# Patient Record
Sex: Female | Born: 1945 | Race: White | Hispanic: No | Marital: Married | Smoking: Current every day smoker
Health system: Southern US, Community
[De-identification: ages and names within clinical notes are randomized; demographics above are authoritative.]

## PROBLEM LIST (undated history)

## (undated) DIAGNOSIS — I10 Essential (primary) hypertension: Secondary | ICD-10-CM

---

## 2001-09-12 ENCOUNTER — Ambulatory Visit (HOSPITAL_COMMUNITY): Admission: RE | Admit: 2001-09-12 | Discharge: 2001-09-12 | Payer: Self-pay | Admitting: Pulmonary Disease

## 2009-12-05 ENCOUNTER — Ambulatory Visit (HOSPITAL_COMMUNITY): Admission: RE | Admit: 2009-12-05 | Discharge: 2009-12-05 | Payer: Self-pay | Admitting: Pulmonary Disease

## 2013-03-16 DIAGNOSIS — L570 Actinic keratosis: Secondary | ICD-10-CM | POA: Diagnosis not present

## 2013-03-16 DIAGNOSIS — D0439 Carcinoma in situ of skin of other parts of face: Secondary | ICD-10-CM | POA: Diagnosis not present

## 2013-04-04 DIAGNOSIS — J209 Acute bronchitis, unspecified: Secondary | ICD-10-CM | POA: Diagnosis not present

## 2013-04-04 DIAGNOSIS — J449 Chronic obstructive pulmonary disease, unspecified: Secondary | ICD-10-CM | POA: Diagnosis not present

## 2013-04-04 DIAGNOSIS — J019 Acute sinusitis, unspecified: Secondary | ICD-10-CM | POA: Diagnosis not present

## 2013-04-04 DIAGNOSIS — I10 Essential (primary) hypertension: Secondary | ICD-10-CM | POA: Diagnosis not present

## 2013-04-21 DIAGNOSIS — C44320 Squamous cell carcinoma of skin of unspecified parts of face: Secondary | ICD-10-CM | POA: Diagnosis not present

## 2013-09-06 DIAGNOSIS — Z23 Encounter for immunization: Secondary | ICD-10-CM | POA: Diagnosis not present

## 2014-03-20 DIAGNOSIS — I10 Essential (primary) hypertension: Secondary | ICD-10-CM | POA: Diagnosis not present

## 2014-03-20 DIAGNOSIS — E785 Hyperlipidemia, unspecified: Secondary | ICD-10-CM | POA: Diagnosis not present

## 2014-03-20 DIAGNOSIS — J209 Acute bronchitis, unspecified: Secondary | ICD-10-CM | POA: Diagnosis not present

## 2014-03-20 DIAGNOSIS — J449 Chronic obstructive pulmonary disease, unspecified: Secondary | ICD-10-CM | POA: Diagnosis not present

## 2014-03-21 DIAGNOSIS — S60229A Contusion of unspecified hand, initial encounter: Secondary | ICD-10-CM | POA: Diagnosis not present

## 2014-03-21 DIAGNOSIS — S93409A Sprain of unspecified ligament of unspecified ankle, initial encounter: Secondary | ICD-10-CM | POA: Diagnosis not present

## 2014-03-27 DIAGNOSIS — J449 Chronic obstructive pulmonary disease, unspecified: Secondary | ICD-10-CM | POA: Diagnosis not present

## 2014-03-27 DIAGNOSIS — J019 Acute sinusitis, unspecified: Secondary | ICD-10-CM | POA: Diagnosis not present

## 2014-03-27 DIAGNOSIS — J209 Acute bronchitis, unspecified: Secondary | ICD-10-CM | POA: Diagnosis not present

## 2014-04-21 DIAGNOSIS — T148 Other injury of unspecified body region: Secondary | ICD-10-CM | POA: Diagnosis not present

## 2014-04-26 DIAGNOSIS — H4011X0 Primary open-angle glaucoma, stage unspecified: Secondary | ICD-10-CM | POA: Diagnosis not present

## 2014-04-28 ENCOUNTER — Other Ambulatory Visit (HOSPITAL_COMMUNITY): Payer: Self-pay | Admitting: Pulmonary Disease

## 2014-04-28 DIAGNOSIS — R51 Headache: Principal | ICD-10-CM

## 2014-04-28 DIAGNOSIS — R110 Nausea: Secondary | ICD-10-CM

## 2014-04-28 DIAGNOSIS — R519 Headache, unspecified: Secondary | ICD-10-CM

## 2014-05-01 ENCOUNTER — Ambulatory Visit (HOSPITAL_COMMUNITY)
Admission: RE | Admit: 2014-05-01 | Discharge: 2014-05-01 | Disposition: A | Discharge status: Home / Self Care | Payer: Medicare Other | Source: Ambulatory Visit | Attending: Pulmonary Disease | Admitting: Pulmonary Disease

## 2014-05-01 DIAGNOSIS — I739 Peripheral vascular disease, unspecified: Secondary | ICD-10-CM | POA: Insufficient documentation

## 2014-05-01 DIAGNOSIS — R519 Headache, unspecified: Secondary | ICD-10-CM

## 2014-05-01 DIAGNOSIS — R110 Nausea: Secondary | ICD-10-CM | POA: Insufficient documentation

## 2014-05-01 DIAGNOSIS — R51 Headache: Secondary | ICD-10-CM

## 2014-05-01 DIAGNOSIS — R42 Dizziness and giddiness: Secondary | ICD-10-CM | POA: Insufficient documentation

## 2014-06-12 DIAGNOSIS — D239 Other benign neoplasm of skin, unspecified: Secondary | ICD-10-CM | POA: Diagnosis not present

## 2014-06-12 DIAGNOSIS — L570 Actinic keratosis: Secondary | ICD-10-CM | POA: Diagnosis not present

## 2014-06-12 DIAGNOSIS — L821 Other seborrheic keratosis: Secondary | ICD-10-CM | POA: Diagnosis not present

## 2014-06-28 DIAGNOSIS — E785 Hyperlipidemia, unspecified: Secondary | ICD-10-CM | POA: Diagnosis not present

## 2014-06-28 DIAGNOSIS — F3289 Other specified depressive episodes: Secondary | ICD-10-CM | POA: Diagnosis not present

## 2014-06-28 DIAGNOSIS — F329 Major depressive disorder, single episode, unspecified: Secondary | ICD-10-CM | POA: Diagnosis not present

## 2014-06-28 DIAGNOSIS — I10 Essential (primary) hypertension: Secondary | ICD-10-CM | POA: Diagnosis not present

## 2014-10-03 DIAGNOSIS — I10 Essential (primary) hypertension: Secondary | ICD-10-CM | POA: Diagnosis not present

## 2014-10-03 DIAGNOSIS — Z23 Encounter for immunization: Secondary | ICD-10-CM | POA: Diagnosis not present

## 2014-10-03 DIAGNOSIS — M545 Low back pain: Secondary | ICD-10-CM | POA: Diagnosis not present

## 2014-10-03 DIAGNOSIS — J449 Chronic obstructive pulmonary disease, unspecified: Secondary | ICD-10-CM | POA: Diagnosis not present

## 2014-10-03 DIAGNOSIS — F329 Major depressive disorder, single episode, unspecified: Secondary | ICD-10-CM | POA: Diagnosis not present

## 2015-01-03 DIAGNOSIS — M545 Low back pain: Secondary | ICD-10-CM | POA: Diagnosis not present

## 2015-01-03 DIAGNOSIS — J449 Chronic obstructive pulmonary disease, unspecified: Secondary | ICD-10-CM | POA: Diagnosis not present

## 2015-01-03 DIAGNOSIS — I10 Essential (primary) hypertension: Secondary | ICD-10-CM | POA: Diagnosis not present

## 2015-01-03 DIAGNOSIS — F419 Anxiety disorder, unspecified: Secondary | ICD-10-CM | POA: Diagnosis not present

## 2015-03-13 DIAGNOSIS — L570 Actinic keratosis: Secondary | ICD-10-CM | POA: Diagnosis not present

## 2015-03-13 DIAGNOSIS — D485 Neoplasm of uncertain behavior of skin: Secondary | ICD-10-CM | POA: Diagnosis not present

## 2015-05-10 DIAGNOSIS — J449 Chronic obstructive pulmonary disease, unspecified: Secondary | ICD-10-CM | POA: Diagnosis not present

## 2015-05-10 DIAGNOSIS — J209 Acute bronchitis, unspecified: Secondary | ICD-10-CM | POA: Diagnosis not present

## 2015-05-10 DIAGNOSIS — F419 Anxiety disorder, unspecified: Secondary | ICD-10-CM | POA: Diagnosis not present

## 2015-05-10 DIAGNOSIS — M545 Low back pain: Secondary | ICD-10-CM | POA: Diagnosis not present

## 2015-05-26 DIAGNOSIS — M4316 Spondylolisthesis, lumbar region: Secondary | ICD-10-CM | POA: Diagnosis not present

## 2015-05-26 DIAGNOSIS — M545 Low back pain: Secondary | ICD-10-CM | POA: Diagnosis not present

## 2015-05-26 DIAGNOSIS — M4150 Other secondary scoliosis, site unspecified: Secondary | ICD-10-CM | POA: Diagnosis not present

## 2015-06-26 DIAGNOSIS — M5416 Radiculopathy, lumbar region: Secondary | ICD-10-CM | POA: Diagnosis not present

## 2015-10-15 DIAGNOSIS — E785 Hyperlipidemia, unspecified: Secondary | ICD-10-CM | POA: Diagnosis not present

## 2015-10-15 DIAGNOSIS — J449 Chronic obstructive pulmonary disease, unspecified: Secondary | ICD-10-CM | POA: Diagnosis not present

## 2015-10-15 DIAGNOSIS — E538 Deficiency of other specified B group vitamins: Secondary | ICD-10-CM | POA: Diagnosis not present

## 2015-10-15 DIAGNOSIS — I10 Essential (primary) hypertension: Secondary | ICD-10-CM | POA: Diagnosis not present

## 2015-11-07 DIAGNOSIS — H401131 Primary open-angle glaucoma, bilateral, mild stage: Secondary | ICD-10-CM | POA: Diagnosis not present

## 2015-11-12 DIAGNOSIS — J449 Chronic obstructive pulmonary disease, unspecified: Secondary | ICD-10-CM | POA: Diagnosis not present

## 2015-11-12 DIAGNOSIS — M545 Low back pain: Secondary | ICD-10-CM | POA: Diagnosis not present

## 2015-11-12 DIAGNOSIS — T7840XA Allergy, unspecified, initial encounter: Secondary | ICD-10-CM | POA: Diagnosis not present

## 2015-11-12 DIAGNOSIS — I10 Essential (primary) hypertension: Secondary | ICD-10-CM | POA: Diagnosis not present

## 2015-11-14 DIAGNOSIS — D485 Neoplasm of uncertain behavior of skin: Secondary | ICD-10-CM | POA: Diagnosis not present

## 2015-11-14 DIAGNOSIS — L570 Actinic keratosis: Secondary | ICD-10-CM | POA: Diagnosis not present

## 2015-11-14 DIAGNOSIS — L270 Generalized skin eruption due to drugs and medicaments taken internally: Secondary | ICD-10-CM | POA: Diagnosis not present

## 2016-01-23 DIAGNOSIS — I10 Essential (primary) hypertension: Secondary | ICD-10-CM | POA: Diagnosis not present

## 2016-01-23 DIAGNOSIS — F419 Anxiety disorder, unspecified: Secondary | ICD-10-CM | POA: Diagnosis not present

## 2016-01-23 DIAGNOSIS — J441 Chronic obstructive pulmonary disease with (acute) exacerbation: Secondary | ICD-10-CM | POA: Diagnosis not present

## 2016-01-23 DIAGNOSIS — M545 Low back pain: Secondary | ICD-10-CM | POA: Diagnosis not present

## 2016-02-13 DIAGNOSIS — M5136 Other intervertebral disc degeneration, lumbar region: Secondary | ICD-10-CM | POA: Diagnosis not present

## 2016-02-13 DIAGNOSIS — M62830 Muscle spasm of back: Secondary | ICD-10-CM | POA: Diagnosis not present

## 2016-02-13 DIAGNOSIS — M9903 Segmental and somatic dysfunction of lumbar region: Secondary | ICD-10-CM | POA: Diagnosis not present

## 2016-02-13 DIAGNOSIS — M545 Low back pain: Secondary | ICD-10-CM | POA: Diagnosis not present

## 2016-02-14 DIAGNOSIS — M5136 Other intervertebral disc degeneration, lumbar region: Secondary | ICD-10-CM | POA: Diagnosis not present

## 2016-02-14 DIAGNOSIS — M62830 Muscle spasm of back: Secondary | ICD-10-CM | POA: Diagnosis not present

## 2016-02-14 DIAGNOSIS — M9903 Segmental and somatic dysfunction of lumbar region: Secondary | ICD-10-CM | POA: Diagnosis not present

## 2016-02-14 DIAGNOSIS — M545 Low back pain: Secondary | ICD-10-CM | POA: Diagnosis not present

## 2016-02-16 DIAGNOSIS — M5136 Other intervertebral disc degeneration, lumbar region: Secondary | ICD-10-CM | POA: Diagnosis not present

## 2016-02-16 DIAGNOSIS — M545 Low back pain: Secondary | ICD-10-CM | POA: Diagnosis not present

## 2016-02-16 DIAGNOSIS — M62830 Muscle spasm of back: Secondary | ICD-10-CM | POA: Diagnosis not present

## 2016-02-16 DIAGNOSIS — M9903 Segmental and somatic dysfunction of lumbar region: Secondary | ICD-10-CM | POA: Diagnosis not present

## 2016-02-19 DIAGNOSIS — M545 Low back pain: Secondary | ICD-10-CM | POA: Diagnosis not present

## 2016-02-19 DIAGNOSIS — M9903 Segmental and somatic dysfunction of lumbar region: Secondary | ICD-10-CM | POA: Diagnosis not present

## 2016-02-19 DIAGNOSIS — M5136 Other intervertebral disc degeneration, lumbar region: Secondary | ICD-10-CM | POA: Diagnosis not present

## 2016-02-19 DIAGNOSIS — M62830 Muscle spasm of back: Secondary | ICD-10-CM | POA: Diagnosis not present

## 2016-02-21 DIAGNOSIS — M545 Low back pain: Secondary | ICD-10-CM | POA: Diagnosis not present

## 2016-02-21 DIAGNOSIS — M62830 Muscle spasm of back: Secondary | ICD-10-CM | POA: Diagnosis not present

## 2016-02-21 DIAGNOSIS — M5136 Other intervertebral disc degeneration, lumbar region: Secondary | ICD-10-CM | POA: Diagnosis not present

## 2016-02-21 DIAGNOSIS — M9903 Segmental and somatic dysfunction of lumbar region: Secondary | ICD-10-CM | POA: Diagnosis not present

## 2016-02-23 DIAGNOSIS — M9903 Segmental and somatic dysfunction of lumbar region: Secondary | ICD-10-CM | POA: Diagnosis not present

## 2016-02-23 DIAGNOSIS — M545 Low back pain: Secondary | ICD-10-CM | POA: Diagnosis not present

## 2016-02-23 DIAGNOSIS — M5136 Other intervertebral disc degeneration, lumbar region: Secondary | ICD-10-CM | POA: Diagnosis not present

## 2016-02-23 DIAGNOSIS — M62830 Muscle spasm of back: Secondary | ICD-10-CM | POA: Diagnosis not present

## 2016-02-26 DIAGNOSIS — M62830 Muscle spasm of back: Secondary | ICD-10-CM | POA: Diagnosis not present

## 2016-02-26 DIAGNOSIS — M5136 Other intervertebral disc degeneration, lumbar region: Secondary | ICD-10-CM | POA: Diagnosis not present

## 2016-02-26 DIAGNOSIS — M545 Low back pain: Secondary | ICD-10-CM | POA: Diagnosis not present

## 2016-02-26 DIAGNOSIS — M9903 Segmental and somatic dysfunction of lumbar region: Secondary | ICD-10-CM | POA: Diagnosis not present

## 2016-02-28 DIAGNOSIS — M9903 Segmental and somatic dysfunction of lumbar region: Secondary | ICD-10-CM | POA: Diagnosis not present

## 2016-02-28 DIAGNOSIS — M5136 Other intervertebral disc degeneration, lumbar region: Secondary | ICD-10-CM | POA: Diagnosis not present

## 2016-02-28 DIAGNOSIS — M62830 Muscle spasm of back: Secondary | ICD-10-CM | POA: Diagnosis not present

## 2016-02-28 DIAGNOSIS — M545 Low back pain: Secondary | ICD-10-CM | POA: Diagnosis not present

## 2016-03-01 DIAGNOSIS — M62830 Muscle spasm of back: Secondary | ICD-10-CM | POA: Diagnosis not present

## 2016-03-01 DIAGNOSIS — M9903 Segmental and somatic dysfunction of lumbar region: Secondary | ICD-10-CM | POA: Diagnosis not present

## 2016-03-01 DIAGNOSIS — M5136 Other intervertebral disc degeneration, lumbar region: Secondary | ICD-10-CM | POA: Diagnosis not present

## 2016-03-01 DIAGNOSIS — M545 Low back pain: Secondary | ICD-10-CM | POA: Diagnosis not present

## 2016-03-03 DIAGNOSIS — M5136 Other intervertebral disc degeneration, lumbar region: Secondary | ICD-10-CM | POA: Diagnosis not present

## 2016-03-03 DIAGNOSIS — M62830 Muscle spasm of back: Secondary | ICD-10-CM | POA: Diagnosis not present

## 2016-03-03 DIAGNOSIS — M545 Low back pain: Secondary | ICD-10-CM | POA: Diagnosis not present

## 2016-03-03 DIAGNOSIS — M9903 Segmental and somatic dysfunction of lumbar region: Secondary | ICD-10-CM | POA: Diagnosis not present

## 2016-03-04 DIAGNOSIS — M62830 Muscle spasm of back: Secondary | ICD-10-CM | POA: Diagnosis not present

## 2016-03-04 DIAGNOSIS — M5136 Other intervertebral disc degeneration, lumbar region: Secondary | ICD-10-CM | POA: Diagnosis not present

## 2016-03-04 DIAGNOSIS — M545 Low back pain: Secondary | ICD-10-CM | POA: Diagnosis not present

## 2016-03-04 DIAGNOSIS — M9903 Segmental and somatic dysfunction of lumbar region: Secondary | ICD-10-CM | POA: Diagnosis not present

## 2016-03-06 DIAGNOSIS — M5136 Other intervertebral disc degeneration, lumbar region: Secondary | ICD-10-CM | POA: Diagnosis not present

## 2016-03-06 DIAGNOSIS — M9903 Segmental and somatic dysfunction of lumbar region: Secondary | ICD-10-CM | POA: Diagnosis not present

## 2016-03-06 DIAGNOSIS — M545 Low back pain: Secondary | ICD-10-CM | POA: Diagnosis not present

## 2016-03-06 DIAGNOSIS — M62830 Muscle spasm of back: Secondary | ICD-10-CM | POA: Diagnosis not present

## 2016-03-11 DIAGNOSIS — M9903 Segmental and somatic dysfunction of lumbar region: Secondary | ICD-10-CM | POA: Diagnosis not present

## 2016-03-11 DIAGNOSIS — M545 Low back pain: Secondary | ICD-10-CM | POA: Diagnosis not present

## 2016-03-11 DIAGNOSIS — M62830 Muscle spasm of back: Secondary | ICD-10-CM | POA: Diagnosis not present

## 2016-03-11 DIAGNOSIS — M5136 Other intervertebral disc degeneration, lumbar region: Secondary | ICD-10-CM | POA: Diagnosis not present

## 2016-03-14 DIAGNOSIS — M62830 Muscle spasm of back: Secondary | ICD-10-CM | POA: Diagnosis not present

## 2016-03-14 DIAGNOSIS — M545 Low back pain: Secondary | ICD-10-CM | POA: Diagnosis not present

## 2016-03-14 DIAGNOSIS — M9903 Segmental and somatic dysfunction of lumbar region: Secondary | ICD-10-CM | POA: Diagnosis not present

## 2016-03-14 DIAGNOSIS — M5136 Other intervertebral disc degeneration, lumbar region: Secondary | ICD-10-CM | POA: Diagnosis not present

## 2016-03-18 DIAGNOSIS — M5136 Other intervertebral disc degeneration, lumbar region: Secondary | ICD-10-CM | POA: Diagnosis not present

## 2016-03-18 DIAGNOSIS — M62830 Muscle spasm of back: Secondary | ICD-10-CM | POA: Diagnosis not present

## 2016-03-18 DIAGNOSIS — M545 Low back pain: Secondary | ICD-10-CM | POA: Diagnosis not present

## 2016-03-18 DIAGNOSIS — M9903 Segmental and somatic dysfunction of lumbar region: Secondary | ICD-10-CM | POA: Diagnosis not present

## 2016-03-20 DIAGNOSIS — M9903 Segmental and somatic dysfunction of lumbar region: Secondary | ICD-10-CM | POA: Diagnosis not present

## 2016-03-20 DIAGNOSIS — M62830 Muscle spasm of back: Secondary | ICD-10-CM | POA: Diagnosis not present

## 2016-03-20 DIAGNOSIS — M545 Low back pain: Secondary | ICD-10-CM | POA: Diagnosis not present

## 2016-03-20 DIAGNOSIS — M5136 Other intervertebral disc degeneration, lumbar region: Secondary | ICD-10-CM | POA: Diagnosis not present

## 2016-03-22 DIAGNOSIS — M62830 Muscle spasm of back: Secondary | ICD-10-CM | POA: Diagnosis not present

## 2016-03-22 DIAGNOSIS — M545 Low back pain: Secondary | ICD-10-CM | POA: Diagnosis not present

## 2016-03-22 DIAGNOSIS — M9903 Segmental and somatic dysfunction of lumbar region: Secondary | ICD-10-CM | POA: Diagnosis not present

## 2016-03-22 DIAGNOSIS — M5136 Other intervertebral disc degeneration, lumbar region: Secondary | ICD-10-CM | POA: Diagnosis not present

## 2016-03-25 DIAGNOSIS — M5136 Other intervertebral disc degeneration, lumbar region: Secondary | ICD-10-CM | POA: Diagnosis not present

## 2016-03-25 DIAGNOSIS — M545 Low back pain: Secondary | ICD-10-CM | POA: Diagnosis not present

## 2016-03-25 DIAGNOSIS — M9903 Segmental and somatic dysfunction of lumbar region: Secondary | ICD-10-CM | POA: Diagnosis not present

## 2016-03-25 DIAGNOSIS — M62830 Muscle spasm of back: Secondary | ICD-10-CM | POA: Diagnosis not present

## 2016-03-26 DIAGNOSIS — M9903 Segmental and somatic dysfunction of lumbar region: Secondary | ICD-10-CM | POA: Diagnosis not present

## 2016-03-26 DIAGNOSIS — I10 Essential (primary) hypertension: Secondary | ICD-10-CM | POA: Diagnosis not present

## 2016-03-26 DIAGNOSIS — J449 Chronic obstructive pulmonary disease, unspecified: Secondary | ICD-10-CM | POA: Diagnosis not present

## 2016-03-26 DIAGNOSIS — M545 Low back pain: Secondary | ICD-10-CM | POA: Diagnosis not present

## 2016-03-26 DIAGNOSIS — M62830 Muscle spasm of back: Secondary | ICD-10-CM | POA: Diagnosis not present

## 2016-03-26 DIAGNOSIS — M5136 Other intervertebral disc degeneration, lumbar region: Secondary | ICD-10-CM | POA: Diagnosis not present

## 2016-03-27 DIAGNOSIS — M62830 Muscle spasm of back: Secondary | ICD-10-CM | POA: Diagnosis not present

## 2016-03-27 DIAGNOSIS — M5136 Other intervertebral disc degeneration, lumbar region: Secondary | ICD-10-CM | POA: Diagnosis not present

## 2016-03-27 DIAGNOSIS — M9903 Segmental and somatic dysfunction of lumbar region: Secondary | ICD-10-CM | POA: Diagnosis not present

## 2016-03-27 DIAGNOSIS — M545 Low back pain: Secondary | ICD-10-CM | POA: Diagnosis not present

## 2016-04-01 DIAGNOSIS — M9903 Segmental and somatic dysfunction of lumbar region: Secondary | ICD-10-CM | POA: Diagnosis not present

## 2016-04-01 DIAGNOSIS — M62830 Muscle spasm of back: Secondary | ICD-10-CM | POA: Diagnosis not present

## 2016-04-01 DIAGNOSIS — M5136 Other intervertebral disc degeneration, lumbar region: Secondary | ICD-10-CM | POA: Diagnosis not present

## 2016-04-01 DIAGNOSIS — M545 Low back pain: Secondary | ICD-10-CM | POA: Diagnosis not present

## 2016-04-02 DIAGNOSIS — M5136 Other intervertebral disc degeneration, lumbar region: Secondary | ICD-10-CM | POA: Diagnosis not present

## 2016-04-02 DIAGNOSIS — M545 Low back pain: Secondary | ICD-10-CM | POA: Diagnosis not present

## 2016-04-02 DIAGNOSIS — M9903 Segmental and somatic dysfunction of lumbar region: Secondary | ICD-10-CM | POA: Diagnosis not present

## 2016-04-02 DIAGNOSIS — M62830 Muscle spasm of back: Secondary | ICD-10-CM | POA: Diagnosis not present

## 2016-04-10 DIAGNOSIS — M5136 Other intervertebral disc degeneration, lumbar region: Secondary | ICD-10-CM | POA: Diagnosis not present

## 2016-04-10 DIAGNOSIS — M5442 Lumbago with sciatica, left side: Secondary | ICD-10-CM | POA: Diagnosis not present

## 2016-04-10 DIAGNOSIS — M1288 Other specific arthropathies, not elsewhere classified, other specified site: Secondary | ICD-10-CM | POA: Diagnosis not present

## 2016-04-10 DIAGNOSIS — M4316 Spondylolisthesis, lumbar region: Secondary | ICD-10-CM | POA: Diagnosis not present

## 2016-04-19 DIAGNOSIS — M545 Low back pain: Secondary | ICD-10-CM | POA: Diagnosis not present

## 2016-04-23 DIAGNOSIS — M5136 Other intervertebral disc degeneration, lumbar region: Secondary | ICD-10-CM | POA: Diagnosis not present

## 2016-04-23 DIAGNOSIS — M9903 Segmental and somatic dysfunction of lumbar region: Secondary | ICD-10-CM | POA: Diagnosis not present

## 2016-04-23 DIAGNOSIS — M545 Low back pain: Secondary | ICD-10-CM | POA: Diagnosis not present

## 2016-04-23 DIAGNOSIS — M62830 Muscle spasm of back: Secondary | ICD-10-CM | POA: Diagnosis not present

## 2016-04-25 DIAGNOSIS — M4150 Other secondary scoliosis, site unspecified: Secondary | ICD-10-CM | POA: Diagnosis not present

## 2016-04-25 DIAGNOSIS — M5136 Other intervertebral disc degeneration, lumbar region: Secondary | ICD-10-CM | POA: Diagnosis not present

## 2016-04-25 DIAGNOSIS — M4316 Spondylolisthesis, lumbar region: Secondary | ICD-10-CM | POA: Diagnosis not present

## 2016-05-01 DIAGNOSIS — M5136 Other intervertebral disc degeneration, lumbar region: Secondary | ICD-10-CM | POA: Diagnosis not present

## 2016-05-01 DIAGNOSIS — M62830 Muscle spasm of back: Secondary | ICD-10-CM | POA: Diagnosis not present

## 2016-05-01 DIAGNOSIS — M9903 Segmental and somatic dysfunction of lumbar region: Secondary | ICD-10-CM | POA: Diagnosis not present

## 2016-05-01 DIAGNOSIS — M545 Low back pain: Secondary | ICD-10-CM | POA: Diagnosis not present

## 2016-05-06 DIAGNOSIS — M62830 Muscle spasm of back: Secondary | ICD-10-CM | POA: Diagnosis not present

## 2016-05-06 DIAGNOSIS — M5136 Other intervertebral disc degeneration, lumbar region: Secondary | ICD-10-CM | POA: Diagnosis not present

## 2016-05-06 DIAGNOSIS — M9903 Segmental and somatic dysfunction of lumbar region: Secondary | ICD-10-CM | POA: Diagnosis not present

## 2016-05-06 DIAGNOSIS — M545 Low back pain: Secondary | ICD-10-CM | POA: Diagnosis not present

## 2016-05-08 DIAGNOSIS — M9903 Segmental and somatic dysfunction of lumbar region: Secondary | ICD-10-CM | POA: Diagnosis not present

## 2016-05-08 DIAGNOSIS — M545 Low back pain: Secondary | ICD-10-CM | POA: Diagnosis not present

## 2016-05-08 DIAGNOSIS — M5136 Other intervertebral disc degeneration, lumbar region: Secondary | ICD-10-CM | POA: Diagnosis not present

## 2016-05-08 DIAGNOSIS — M62830 Muscle spasm of back: Secondary | ICD-10-CM | POA: Diagnosis not present

## 2016-05-13 DIAGNOSIS — M5136 Other intervertebral disc degeneration, lumbar region: Secondary | ICD-10-CM | POA: Diagnosis not present

## 2016-05-13 DIAGNOSIS — M62830 Muscle spasm of back: Secondary | ICD-10-CM | POA: Diagnosis not present

## 2016-05-13 DIAGNOSIS — M545 Low back pain: Secondary | ICD-10-CM | POA: Diagnosis not present

## 2016-05-13 DIAGNOSIS — M9903 Segmental and somatic dysfunction of lumbar region: Secondary | ICD-10-CM | POA: Diagnosis not present

## 2016-05-14 DIAGNOSIS — M5136 Other intervertebral disc degeneration, lumbar region: Secondary | ICD-10-CM | POA: Diagnosis not present

## 2016-05-15 ENCOUNTER — Other Ambulatory Visit (HOSPITAL_COMMUNITY): Payer: Self-pay | Admitting: Pulmonary Disease

## 2016-05-15 DIAGNOSIS — M9903 Segmental and somatic dysfunction of lumbar region: Secondary | ICD-10-CM | POA: Diagnosis not present

## 2016-05-15 DIAGNOSIS — M62830 Muscle spasm of back: Secondary | ICD-10-CM | POA: Diagnosis not present

## 2016-05-15 DIAGNOSIS — F419 Anxiety disorder, unspecified: Secondary | ICD-10-CM | POA: Diagnosis not present

## 2016-05-15 DIAGNOSIS — J449 Chronic obstructive pulmonary disease, unspecified: Secondary | ICD-10-CM | POA: Diagnosis not present

## 2016-05-15 DIAGNOSIS — M545 Low back pain: Secondary | ICD-10-CM | POA: Diagnosis not present

## 2016-05-15 DIAGNOSIS — M5136 Other intervertebral disc degeneration, lumbar region: Secondary | ICD-10-CM | POA: Diagnosis not present

## 2016-05-15 DIAGNOSIS — E279 Disorder of adrenal gland, unspecified: Principal | ICD-10-CM

## 2016-05-15 DIAGNOSIS — I10 Essential (primary) hypertension: Secondary | ICD-10-CM | POA: Diagnosis not present

## 2016-05-15 DIAGNOSIS — E278 Other specified disorders of adrenal gland: Secondary | ICD-10-CM

## 2016-05-20 DIAGNOSIS — M5136 Other intervertebral disc degeneration, lumbar region: Secondary | ICD-10-CM | POA: Diagnosis not present

## 2016-05-20 DIAGNOSIS — M9903 Segmental and somatic dysfunction of lumbar region: Secondary | ICD-10-CM | POA: Diagnosis not present

## 2016-05-20 DIAGNOSIS — M545 Low back pain: Secondary | ICD-10-CM | POA: Diagnosis not present

## 2016-05-20 DIAGNOSIS — M62830 Muscle spasm of back: Secondary | ICD-10-CM | POA: Diagnosis not present

## 2016-05-22 ENCOUNTER — Ambulatory Visit (HOSPITAL_COMMUNITY)
Admission: RE | Admit: 2016-05-22 | Discharge: 2016-05-22 | Disposition: A | Discharge status: Home / Self Care | Payer: Medicare Other | Source: Ambulatory Visit | Attending: Pulmonary Disease | Admitting: Pulmonary Disease

## 2016-05-22 DIAGNOSIS — E279 Disorder of adrenal gland, unspecified: Secondary | ICD-10-CM

## 2016-05-22 DIAGNOSIS — D3502 Benign neoplasm of left adrenal gland: Secondary | ICD-10-CM | POA: Insufficient documentation

## 2016-05-22 DIAGNOSIS — E278 Other specified disorders of adrenal gland: Secondary | ICD-10-CM

## 2016-05-22 LAB — POCT I-STAT CREATININE: Creatinine, Ser: 0.7 mg/dL (ref 0.44–1.00)

## 2016-05-22 MED ORDER — GADOBENATE DIMEGLUMINE 529 MG/ML IV SOLN
20.0000 mL | Freq: Once | INTRAVENOUS | Status: AC | PRN
  Administered 2016-05-22: 20 mL via INTRAVENOUS

## 2016-05-27 DIAGNOSIS — M4316 Spondylolisthesis, lumbar region: Secondary | ICD-10-CM | POA: Diagnosis not present

## 2016-05-27 DIAGNOSIS — M5136 Other intervertebral disc degeneration, lumbar region: Secondary | ICD-10-CM | POA: Diagnosis not present

## 2016-05-27 DIAGNOSIS — M4150 Other secondary scoliosis, site unspecified: Secondary | ICD-10-CM | POA: Diagnosis not present

## 2016-05-29 DIAGNOSIS — M4150 Other secondary scoliosis, site unspecified: Secondary | ICD-10-CM | POA: Diagnosis not present

## 2016-05-29 DIAGNOSIS — M545 Low back pain: Secondary | ICD-10-CM | POA: Diagnosis not present

## 2016-05-29 DIAGNOSIS — M9903 Segmental and somatic dysfunction of lumbar region: Secondary | ICD-10-CM | POA: Diagnosis not present

## 2016-05-29 DIAGNOSIS — M62830 Muscle spasm of back: Secondary | ICD-10-CM | POA: Diagnosis not present

## 2016-05-29 DIAGNOSIS — M4316 Spondylolisthesis, lumbar region: Secondary | ICD-10-CM | POA: Diagnosis not present

## 2016-05-29 DIAGNOSIS — M5136 Other intervertebral disc degeneration, lumbar region: Secondary | ICD-10-CM | POA: Diagnosis not present

## 2016-06-02 DIAGNOSIS — M5136 Other intervertebral disc degeneration, lumbar region: Secondary | ICD-10-CM | POA: Diagnosis not present

## 2016-06-02 DIAGNOSIS — M4150 Other secondary scoliosis, site unspecified: Secondary | ICD-10-CM | POA: Diagnosis not present

## 2016-06-02 DIAGNOSIS — M4316 Spondylolisthesis, lumbar region: Secondary | ICD-10-CM | POA: Diagnosis not present

## 2016-06-05 DIAGNOSIS — M5136 Other intervertebral disc degeneration, lumbar region: Secondary | ICD-10-CM | POA: Diagnosis not present

## 2016-06-05 DIAGNOSIS — M4150 Other secondary scoliosis, site unspecified: Secondary | ICD-10-CM | POA: Diagnosis not present

## 2016-06-05 DIAGNOSIS — M4316 Spondylolisthesis, lumbar region: Secondary | ICD-10-CM | POA: Diagnosis not present

## 2016-06-09 DIAGNOSIS — M4150 Other secondary scoliosis, site unspecified: Secondary | ICD-10-CM | POA: Diagnosis not present

## 2016-06-09 DIAGNOSIS — M4316 Spondylolisthesis, lumbar region: Secondary | ICD-10-CM | POA: Diagnosis not present

## 2016-06-09 DIAGNOSIS — M5136 Other intervertebral disc degeneration, lumbar region: Secondary | ICD-10-CM | POA: Diagnosis not present

## 2016-06-23 DIAGNOSIS — M1288 Other specific arthropathies, not elsewhere classified, other specified site: Secondary | ICD-10-CM | POA: Diagnosis not present

## 2016-06-23 DIAGNOSIS — M4316 Spondylolisthesis, lumbar region: Secondary | ICD-10-CM | POA: Diagnosis not present

## 2016-06-23 DIAGNOSIS — M5136 Other intervertebral disc degeneration, lumbar region: Secondary | ICD-10-CM | POA: Diagnosis not present

## 2016-06-23 DIAGNOSIS — M4150 Other secondary scoliosis, site unspecified: Secondary | ICD-10-CM | POA: Diagnosis not present

## 2016-06-25 DIAGNOSIS — I10 Essential (primary) hypertension: Secondary | ICD-10-CM | POA: Diagnosis not present

## 2016-06-25 DIAGNOSIS — F17200 Nicotine dependence, unspecified, uncomplicated: Secondary | ICD-10-CM | POA: Diagnosis not present

## 2016-06-25 DIAGNOSIS — M545 Low back pain: Secondary | ICD-10-CM | POA: Diagnosis not present

## 2016-06-25 DIAGNOSIS — J449 Chronic obstructive pulmonary disease, unspecified: Secondary | ICD-10-CM | POA: Diagnosis not present

## 2016-07-10 DIAGNOSIS — M62830 Muscle spasm of back: Secondary | ICD-10-CM | POA: Diagnosis not present

## 2016-07-10 DIAGNOSIS — M5136 Other intervertebral disc degeneration, lumbar region: Secondary | ICD-10-CM | POA: Diagnosis not present

## 2016-07-10 DIAGNOSIS — M9903 Segmental and somatic dysfunction of lumbar region: Secondary | ICD-10-CM | POA: Diagnosis not present

## 2016-07-10 DIAGNOSIS — M545 Low back pain: Secondary | ICD-10-CM | POA: Diagnosis not present

## 2016-07-16 DIAGNOSIS — M5136 Other intervertebral disc degeneration, lumbar region: Secondary | ICD-10-CM | POA: Diagnosis not present

## 2016-07-17 DIAGNOSIS — M62830 Muscle spasm of back: Secondary | ICD-10-CM | POA: Diagnosis not present

## 2016-07-17 DIAGNOSIS — M9903 Segmental and somatic dysfunction of lumbar region: Secondary | ICD-10-CM | POA: Diagnosis not present

## 2016-07-17 DIAGNOSIS — M5136 Other intervertebral disc degeneration, lumbar region: Secondary | ICD-10-CM | POA: Diagnosis not present

## 2016-07-17 DIAGNOSIS — M545 Low back pain: Secondary | ICD-10-CM | POA: Diagnosis not present

## 2016-07-28 ENCOUNTER — Other Ambulatory Visit (HOSPITAL_COMMUNITY): Payer: Self-pay | Admitting: Pulmonary Disease

## 2016-07-28 ENCOUNTER — Ambulatory Visit (HOSPITAL_COMMUNITY)
Admission: RE | Admit: 2016-07-28 | Discharge: 2016-07-28 | Disposition: A | Discharge status: Home / Self Care | Payer: Medicare Other | Source: Ambulatory Visit | Attending: Pulmonary Disease | Admitting: Pulmonary Disease

## 2016-07-28 DIAGNOSIS — R042 Hemoptysis: Secondary | ICD-10-CM

## 2016-07-28 DIAGNOSIS — R918 Other nonspecific abnormal finding of lung field: Secondary | ICD-10-CM | POA: Insufficient documentation

## 2016-07-28 DIAGNOSIS — M62830 Muscle spasm of back: Secondary | ICD-10-CM | POA: Diagnosis not present

## 2016-07-28 DIAGNOSIS — M545 Low back pain: Secondary | ICD-10-CM | POA: Diagnosis not present

## 2016-07-28 DIAGNOSIS — R05 Cough: Secondary | ICD-10-CM | POA: Diagnosis not present

## 2016-07-28 DIAGNOSIS — M9903 Segmental and somatic dysfunction of lumbar region: Secondary | ICD-10-CM | POA: Diagnosis not present

## 2016-07-28 DIAGNOSIS — M5136 Other intervertebral disc degeneration, lumbar region: Secondary | ICD-10-CM | POA: Diagnosis not present

## 2016-07-29 DIAGNOSIS — M5136 Other intervertebral disc degeneration, lumbar region: Secondary | ICD-10-CM | POA: Diagnosis not present

## 2016-07-29 DIAGNOSIS — M4316 Spondylolisthesis, lumbar region: Secondary | ICD-10-CM | POA: Diagnosis not present

## 2016-07-29 DIAGNOSIS — M4150 Other secondary scoliosis, site unspecified: Secondary | ICD-10-CM | POA: Diagnosis not present

## 2016-07-31 DIAGNOSIS — M545 Low back pain: Secondary | ICD-10-CM | POA: Diagnosis not present

## 2016-07-31 DIAGNOSIS — M5136 Other intervertebral disc degeneration, lumbar region: Secondary | ICD-10-CM | POA: Diagnosis not present

## 2016-07-31 DIAGNOSIS — M9903 Segmental and somatic dysfunction of lumbar region: Secondary | ICD-10-CM | POA: Diagnosis not present

## 2016-07-31 DIAGNOSIS — M62830 Muscle spasm of back: Secondary | ICD-10-CM | POA: Diagnosis not present

## 2016-08-06 DIAGNOSIS — M9903 Segmental and somatic dysfunction of lumbar region: Secondary | ICD-10-CM | POA: Diagnosis not present

## 2016-08-06 DIAGNOSIS — M545 Low back pain: Secondary | ICD-10-CM | POA: Diagnosis not present

## 2016-08-06 DIAGNOSIS — M62830 Muscle spasm of back: Secondary | ICD-10-CM | POA: Diagnosis not present

## 2016-08-06 DIAGNOSIS — M5136 Other intervertebral disc degeneration, lumbar region: Secondary | ICD-10-CM | POA: Diagnosis not present

## 2016-08-13 ENCOUNTER — Other Ambulatory Visit: Payer: Self-pay

## 2016-08-14 DIAGNOSIS — M5136 Other intervertebral disc degeneration, lumbar region: Secondary | ICD-10-CM | POA: Diagnosis not present

## 2016-08-14 DIAGNOSIS — M62830 Muscle spasm of back: Secondary | ICD-10-CM | POA: Diagnosis not present

## 2016-08-14 DIAGNOSIS — M9903 Segmental and somatic dysfunction of lumbar region: Secondary | ICD-10-CM | POA: Diagnosis not present

## 2016-08-14 DIAGNOSIS — M545 Low back pain: Secondary | ICD-10-CM | POA: Diagnosis not present

## 2016-08-21 DIAGNOSIS — M5136 Other intervertebral disc degeneration, lumbar region: Secondary | ICD-10-CM | POA: Diagnosis not present

## 2016-08-21 DIAGNOSIS — M62830 Muscle spasm of back: Secondary | ICD-10-CM | POA: Diagnosis not present

## 2016-08-21 DIAGNOSIS — M9903 Segmental and somatic dysfunction of lumbar region: Secondary | ICD-10-CM | POA: Diagnosis not present

## 2016-08-21 DIAGNOSIS — M545 Low back pain: Secondary | ICD-10-CM | POA: Diagnosis not present

## 2016-08-28 DIAGNOSIS — M9903 Segmental and somatic dysfunction of lumbar region: Secondary | ICD-10-CM | POA: Diagnosis not present

## 2016-08-28 DIAGNOSIS — M545 Low back pain: Secondary | ICD-10-CM | POA: Diagnosis not present

## 2016-08-28 DIAGNOSIS — M5136 Other intervertebral disc degeneration, lumbar region: Secondary | ICD-10-CM | POA: Diagnosis not present

## 2016-08-28 DIAGNOSIS — M62830 Muscle spasm of back: Secondary | ICD-10-CM | POA: Diagnosis not present

## 2016-09-04 DIAGNOSIS — M545 Low back pain: Secondary | ICD-10-CM | POA: Diagnosis not present

## 2016-09-04 DIAGNOSIS — M62830 Muscle spasm of back: Secondary | ICD-10-CM | POA: Diagnosis not present

## 2016-09-04 DIAGNOSIS — M5136 Other intervertebral disc degeneration, lumbar region: Secondary | ICD-10-CM | POA: Diagnosis not present

## 2016-09-04 DIAGNOSIS — M9903 Segmental and somatic dysfunction of lumbar region: Secondary | ICD-10-CM | POA: Diagnosis not present

## 2016-09-11 DIAGNOSIS — M9903 Segmental and somatic dysfunction of lumbar region: Secondary | ICD-10-CM | POA: Diagnosis not present

## 2016-09-11 DIAGNOSIS — M545 Low back pain: Secondary | ICD-10-CM | POA: Diagnosis not present

## 2016-09-11 DIAGNOSIS — M62830 Muscle spasm of back: Secondary | ICD-10-CM | POA: Diagnosis not present

## 2016-09-11 DIAGNOSIS — M5136 Other intervertebral disc degeneration, lumbar region: Secondary | ICD-10-CM | POA: Diagnosis not present

## 2016-09-18 DIAGNOSIS — M5136 Other intervertebral disc degeneration, lumbar region: Secondary | ICD-10-CM | POA: Diagnosis not present

## 2016-09-18 DIAGNOSIS — M545 Low back pain: Secondary | ICD-10-CM | POA: Diagnosis not present

## 2016-09-18 DIAGNOSIS — M9903 Segmental and somatic dysfunction of lumbar region: Secondary | ICD-10-CM | POA: Diagnosis not present

## 2016-09-18 DIAGNOSIS — M62830 Muscle spasm of back: Secondary | ICD-10-CM | POA: Diagnosis not present

## 2016-09-24 DIAGNOSIS — F17200 Nicotine dependence, unspecified, uncomplicated: Secondary | ICD-10-CM | POA: Diagnosis not present

## 2016-09-24 DIAGNOSIS — J449 Chronic obstructive pulmonary disease, unspecified: Secondary | ICD-10-CM | POA: Diagnosis not present

## 2016-09-24 DIAGNOSIS — Z23 Encounter for immunization: Secondary | ICD-10-CM | POA: Diagnosis not present

## 2016-09-24 DIAGNOSIS — I10 Essential (primary) hypertension: Secondary | ICD-10-CM | POA: Diagnosis not present

## 2016-09-24 DIAGNOSIS — F419 Anxiety disorder, unspecified: Secondary | ICD-10-CM | POA: Diagnosis not present

## 2016-09-25 DIAGNOSIS — M62830 Muscle spasm of back: Secondary | ICD-10-CM | POA: Diagnosis not present

## 2016-09-25 DIAGNOSIS — M9903 Segmental and somatic dysfunction of lumbar region: Secondary | ICD-10-CM | POA: Diagnosis not present

## 2016-09-25 DIAGNOSIS — M545 Low back pain: Secondary | ICD-10-CM | POA: Diagnosis not present

## 2016-09-25 DIAGNOSIS — M5136 Other intervertebral disc degeneration, lumbar region: Secondary | ICD-10-CM | POA: Diagnosis not present

## 2016-10-09 DIAGNOSIS — M9903 Segmental and somatic dysfunction of lumbar region: Secondary | ICD-10-CM | POA: Diagnosis not present

## 2016-10-09 DIAGNOSIS — M62830 Muscle spasm of back: Secondary | ICD-10-CM | POA: Diagnosis not present

## 2016-10-09 DIAGNOSIS — M545 Low back pain: Secondary | ICD-10-CM | POA: Diagnosis not present

## 2016-10-09 DIAGNOSIS — M5136 Other intervertebral disc degeneration, lumbar region: Secondary | ICD-10-CM | POA: Diagnosis not present

## 2016-10-16 DIAGNOSIS — M545 Low back pain: Secondary | ICD-10-CM | POA: Diagnosis not present

## 2016-10-16 DIAGNOSIS — M62830 Muscle spasm of back: Secondary | ICD-10-CM | POA: Diagnosis not present

## 2016-10-16 DIAGNOSIS — M5136 Other intervertebral disc degeneration, lumbar region: Secondary | ICD-10-CM | POA: Diagnosis not present

## 2016-10-16 DIAGNOSIS — M9903 Segmental and somatic dysfunction of lumbar region: Secondary | ICD-10-CM | POA: Diagnosis not present

## 2016-10-23 DIAGNOSIS — M9903 Segmental and somatic dysfunction of lumbar region: Secondary | ICD-10-CM | POA: Diagnosis not present

## 2016-10-23 DIAGNOSIS — M545 Low back pain: Secondary | ICD-10-CM | POA: Diagnosis not present

## 2016-10-23 DIAGNOSIS — M62830 Muscle spasm of back: Secondary | ICD-10-CM | POA: Diagnosis not present

## 2016-10-23 DIAGNOSIS — M5136 Other intervertebral disc degeneration, lumbar region: Secondary | ICD-10-CM | POA: Diagnosis not present

## 2016-11-13 DIAGNOSIS — M9903 Segmental and somatic dysfunction of lumbar region: Secondary | ICD-10-CM | POA: Diagnosis not present

## 2016-11-13 DIAGNOSIS — M62830 Muscle spasm of back: Secondary | ICD-10-CM | POA: Diagnosis not present

## 2016-11-13 DIAGNOSIS — M5136 Other intervertebral disc degeneration, lumbar region: Secondary | ICD-10-CM | POA: Diagnosis not present

## 2016-11-13 DIAGNOSIS — M545 Low back pain: Secondary | ICD-10-CM | POA: Diagnosis not present

## 2016-11-27 DIAGNOSIS — M9903 Segmental and somatic dysfunction of lumbar region: Secondary | ICD-10-CM | POA: Diagnosis not present

## 2016-11-27 DIAGNOSIS — M545 Low back pain: Secondary | ICD-10-CM | POA: Diagnosis not present

## 2016-11-27 DIAGNOSIS — M62830 Muscle spasm of back: Secondary | ICD-10-CM | POA: Diagnosis not present

## 2016-11-27 DIAGNOSIS — M5136 Other intervertebral disc degeneration, lumbar region: Secondary | ICD-10-CM | POA: Diagnosis not present

## 2016-12-17 DIAGNOSIS — M5136 Other intervertebral disc degeneration, lumbar region: Secondary | ICD-10-CM | POA: Diagnosis not present

## 2016-12-17 DIAGNOSIS — M545 Low back pain: Secondary | ICD-10-CM | POA: Diagnosis not present

## 2016-12-17 DIAGNOSIS — M62830 Muscle spasm of back: Secondary | ICD-10-CM | POA: Diagnosis not present

## 2016-12-17 DIAGNOSIS — M9903 Segmental and somatic dysfunction of lumbar region: Secondary | ICD-10-CM | POA: Diagnosis not present

## 2017-01-02 ENCOUNTER — Other Ambulatory Visit: Payer: Self-pay | Admitting: Acute Care

## 2017-01-02 DIAGNOSIS — F17210 Nicotine dependence, cigarettes, uncomplicated: Principal | ICD-10-CM

## 2017-01-07 ENCOUNTER — Ambulatory Visit (INDEPENDENT_AMBULATORY_CARE_PROVIDER_SITE_OTHER)
Admission: RE | Admit: 2017-01-07 | Discharge: 2017-01-07 | Disposition: A | Discharge status: Home / Self Care | Payer: Medicare Other | Source: Ambulatory Visit | Attending: Acute Care | Admitting: Acute Care

## 2017-01-07 ENCOUNTER — Encounter: Payer: Self-pay | Admitting: Acute Care

## 2017-01-07 ENCOUNTER — Ambulatory Visit (INDEPENDENT_AMBULATORY_CARE_PROVIDER_SITE_OTHER): Payer: Medicare Other | Admitting: Acute Care

## 2017-01-07 DIAGNOSIS — Z87891 Personal history of nicotine dependence: Secondary | ICD-10-CM

## 2017-01-07 DIAGNOSIS — F17210 Nicotine dependence, cigarettes, uncomplicated: Secondary | ICD-10-CM | POA: Diagnosis not present

## 2017-01-07 NOTE — Progress Notes (Signed)
Shared Decision Making Visit Lung Cancer Screening Program [id number])   Eligibility:  Age 71 y.o.  Pack Years Smoking History Calculation 60 pack year smoker (# packs/per year x # years smoked)  Recent History of coughing up blood  no  Unexplained weight loss? no ( >Than 15 pounds within the last 6 months )  Prior History Lung / other cancer no (Diagnosis within the last 5 years already requiring surveillance chest CT Scans).  Smoking Status Current Smoker  Former Smokers: Years since quit:NA  Quit Date: NA  Visit Components:  Discussion included one or more decision making aids. yes  Discussion included risk/benefits of screening. yes  Discussion included potential follow up diagnostic testing for abnormal scans. yes  Discussion included meaning and risk of over diagnosis. yes  Discussion included meaning and risk of False Positives. yes  Discussion included meaning of total radiation exposure. yes  Counseling Included:  Importance of adherence to annual lung cancer LDCT screening. yes  Impact of comorbidities on ability to participate in the program. yes  Ability and willingness to under diagnostic treatment. yes  Smoking Cessation Counseling:  Current Smokers:   Discussed importance of smoking cessation. yes  Information about tobacco cessation classes and interventions provided to patient. yes  Patient provided with "ticket" for LDCT Scan. yes  Symptomatic Patient. no  Counseling  Diagnosis Code: Tobacco Use Z72.0  Asymptomatic Patient yes  Counseling (Intermediate counseling: > three minutes counseling) [id number]  Former Smokers:   Discussed the importance of maintaining cigarette abstinence. yes  Diagnosis Code: Personal History of Nicotine Dependence. [id number]  Information about tobacco cessation classes and interventions provided to patient. Yes  Patient provided with "ticket" for LDCT Scan. yes  Written Order for Lung Cancer Screening with  LDCT placed in Epic. Yes (CT Chest Lung Cancer Screening Low Dose W/O CM) [id number] Z12.2-Screening of respiratory organs Z87.891-Personal history of nicotine dependence  I have spent 20 minutes of face to face time with Ms. Weitzman discussing the risks and benefits of lung cancer screening. We viewed a power point together that explained in detail the above noted topics. We paused at intervals to allow for questions to be asked and answered to ensure understanding.We discussed that the single most powerful action that she can take to decrease her risk of developing lung cancer is to quit smoking. We discussed whether or not she is ready to commit to setting a quit date. She is not ready to set a quit date, but is interested in reduce to quit.We discussed options for tools to aid in quitting smoking including nicotine replacement therapy, non-nicotine medications, support groups, Quit Smart classes, and behavior modification. We discussed that often times setting smaller, more achievable goals, such as eliminating 1 cigarette a day for a week and then 2 cigarettes a day for a week can be helpful in slowly decreasing the number of cigarettes smoked. This allows for a sense of accomplishment as well as providing a clinical benefit. I gave her the " Be Stronger Than Your Excuses" card with contact information for community resources, classes, free nicotine replacement therapy, and access to mobile apps, text messaging, and on-line smoking cessation help. I have also given her my card and contact information in the event she needs to contact me. We discussed the time and location of the scan, and that either June Leap, CMA, or I will call with the results within 24-48 hours of receiving them. I have provided her with a copy of the power point  we viewed  as a resource in the event they need reinforcement of the concepts we discussed today in the office. The patient verbalized understanding of all of  the above and  had no further questions upon leaving the office. They have my contact information in the event they have any further questions.  We discussed that there is a high incidence of the CAD noted on these scans. We discussed that these scans are non-gated, and therefore degree and severity cannot be determined. The can just confirm presence of calcifications or plaques.She currently has her cholesterol and triglycerides checked on a regular basis by her PCP and she is taking a statin. She verbalized understanding of the above.  Magdalen Spatz, NP 01/07/2017 .

## 2017-01-08 DIAGNOSIS — M5136 Other intervertebral disc degeneration, lumbar region: Secondary | ICD-10-CM | POA: Diagnosis not present

## 2017-01-08 DIAGNOSIS — M545 Low back pain: Secondary | ICD-10-CM | POA: Diagnosis not present

## 2017-01-08 DIAGNOSIS — M9903 Segmental and somatic dysfunction of lumbar region: Secondary | ICD-10-CM | POA: Diagnosis not present

## 2017-01-08 DIAGNOSIS — M62830 Muscle spasm of back: Secondary | ICD-10-CM | POA: Diagnosis not present

## 2017-01-13 ENCOUNTER — Other Ambulatory Visit: Payer: Self-pay | Admitting: Acute Care

## 2017-01-13 DIAGNOSIS — F17210 Nicotine dependence, cigarettes, uncomplicated: Secondary | ICD-10-CM

## 2017-01-16 DIAGNOSIS — J9611 Chronic respiratory failure with hypoxia: Secondary | ICD-10-CM | POA: Diagnosis not present

## 2017-01-16 DIAGNOSIS — G4700 Insomnia, unspecified: Secondary | ICD-10-CM | POA: Diagnosis not present

## 2017-01-16 DIAGNOSIS — J441 Chronic obstructive pulmonary disease with (acute) exacerbation: Secondary | ICD-10-CM | POA: Diagnosis not present

## 2017-01-29 DIAGNOSIS — M9903 Segmental and somatic dysfunction of lumbar region: Secondary | ICD-10-CM | POA: Diagnosis not present

## 2017-01-29 DIAGNOSIS — M62830 Muscle spasm of back: Secondary | ICD-10-CM | POA: Diagnosis not present

## 2017-01-29 DIAGNOSIS — M5136 Other intervertebral disc degeneration, lumbar region: Secondary | ICD-10-CM | POA: Diagnosis not present

## 2017-01-29 DIAGNOSIS — M545 Low back pain: Secondary | ICD-10-CM | POA: Diagnosis not present

## 2017-03-19 DIAGNOSIS — I10 Essential (primary) hypertension: Secondary | ICD-10-CM | POA: Diagnosis not present

## 2017-03-19 DIAGNOSIS — M545 Low back pain: Secondary | ICD-10-CM | POA: Diagnosis not present

## 2017-03-19 DIAGNOSIS — J449 Chronic obstructive pulmonary disease, unspecified: Secondary | ICD-10-CM | POA: Diagnosis not present

## 2017-03-19 DIAGNOSIS — F419 Anxiety disorder, unspecified: Secondary | ICD-10-CM | POA: Diagnosis not present

## 2017-03-23 DIAGNOSIS — D492 Neoplasm of unspecified behavior of bone, soft tissue, and skin: Secondary | ICD-10-CM | POA: Diagnosis not present

## 2017-03-23 DIAGNOSIS — Z85828 Personal history of other malignant neoplasm of skin: Secondary | ICD-10-CM | POA: Diagnosis not present

## 2017-03-23 DIAGNOSIS — L570 Actinic keratosis: Secondary | ICD-10-CM | POA: Diagnosis not present

## 2017-03-24 DIAGNOSIS — H401131 Primary open-angle glaucoma, bilateral, mild stage: Secondary | ICD-10-CM | POA: Diagnosis not present

## 2017-04-06 DIAGNOSIS — H40013 Open angle with borderline findings, low risk, bilateral: Secondary | ICD-10-CM | POA: Diagnosis not present

## 2017-05-21 DIAGNOSIS — M9903 Segmental and somatic dysfunction of lumbar region: Secondary | ICD-10-CM | POA: Diagnosis not present

## 2017-05-21 DIAGNOSIS — M62830 Muscle spasm of back: Secondary | ICD-10-CM | POA: Diagnosis not present

## 2017-05-21 DIAGNOSIS — M545 Low back pain: Secondary | ICD-10-CM | POA: Diagnosis not present

## 2017-05-21 DIAGNOSIS — M5136 Other intervertebral disc degeneration, lumbar region: Secondary | ICD-10-CM | POA: Diagnosis not present

## 2017-05-26 DIAGNOSIS — H25813 Combined forms of age-related cataract, bilateral: Secondary | ICD-10-CM | POA: Diagnosis not present

## 2017-05-26 DIAGNOSIS — H40053 Ocular hypertension, bilateral: Secondary | ICD-10-CM | POA: Diagnosis not present

## 2017-05-26 DIAGNOSIS — H31002 Unspecified chorioretinal scars, left eye: Secondary | ICD-10-CM | POA: Diagnosis not present

## 2017-06-09 DIAGNOSIS — M62830 Muscle spasm of back: Secondary | ICD-10-CM | POA: Diagnosis not present

## 2017-06-09 DIAGNOSIS — M5136 Other intervertebral disc degeneration, lumbar region: Secondary | ICD-10-CM | POA: Diagnosis not present

## 2017-06-09 DIAGNOSIS — M545 Low back pain: Secondary | ICD-10-CM | POA: Diagnosis not present

## 2017-06-09 DIAGNOSIS — M9903 Segmental and somatic dysfunction of lumbar region: Secondary | ICD-10-CM | POA: Diagnosis not present

## 2017-06-18 DIAGNOSIS — M5136 Other intervertebral disc degeneration, lumbar region: Secondary | ICD-10-CM | POA: Diagnosis not present

## 2017-06-18 DIAGNOSIS — M9903 Segmental and somatic dysfunction of lumbar region: Secondary | ICD-10-CM | POA: Diagnosis not present

## 2017-06-18 DIAGNOSIS — M545 Low back pain: Secondary | ICD-10-CM | POA: Diagnosis not present

## 2017-06-18 DIAGNOSIS — M62830 Muscle spasm of back: Secondary | ICD-10-CM | POA: Diagnosis not present

## 2017-07-02 DIAGNOSIS — M62830 Muscle spasm of back: Secondary | ICD-10-CM | POA: Diagnosis not present

## 2017-07-02 DIAGNOSIS — M545 Low back pain: Secondary | ICD-10-CM | POA: Diagnosis not present

## 2017-07-02 DIAGNOSIS — M9903 Segmental and somatic dysfunction of lumbar region: Secondary | ICD-10-CM | POA: Diagnosis not present

## 2017-07-02 DIAGNOSIS — M5136 Other intervertebral disc degeneration, lumbar region: Secondary | ICD-10-CM | POA: Diagnosis not present

## 2017-07-16 DIAGNOSIS — J9611 Chronic respiratory failure with hypoxia: Secondary | ICD-10-CM | POA: Diagnosis not present

## 2017-07-16 DIAGNOSIS — J301 Allergic rhinitis due to pollen: Secondary | ICD-10-CM | POA: Diagnosis not present

## 2017-07-16 DIAGNOSIS — J449 Chronic obstructive pulmonary disease, unspecified: Secondary | ICD-10-CM | POA: Diagnosis not present

## 2017-07-16 DIAGNOSIS — G2581 Restless legs syndrome: Secondary | ICD-10-CM | POA: Diagnosis not present

## 2017-10-09 DIAGNOSIS — H40053 Ocular hypertension, bilateral: Secondary | ICD-10-CM | POA: Diagnosis not present

## 2017-10-09 DIAGNOSIS — H25813 Combined forms of age-related cataract, bilateral: Secondary | ICD-10-CM | POA: Diagnosis not present

## 2017-10-09 DIAGNOSIS — H31002 Unspecified chorioretinal scars, left eye: Secondary | ICD-10-CM | POA: Diagnosis not present

## 2017-11-16 DIAGNOSIS — H25813 Combined forms of age-related cataract, bilateral: Secondary | ICD-10-CM | POA: Diagnosis not present

## 2017-11-16 DIAGNOSIS — H31002 Unspecified chorioretinal scars, left eye: Secondary | ICD-10-CM | POA: Diagnosis not present

## 2017-11-16 DIAGNOSIS — H40053 Ocular hypertension, bilateral: Secondary | ICD-10-CM | POA: Diagnosis not present

## 2018-01-08 ENCOUNTER — Ambulatory Visit (HOSPITAL_COMMUNITY)
Admission: RE | Admit: 2018-01-08 | Discharge: 2018-01-08 | Disposition: A | Discharge status: Home / Self Care | Payer: Medicare Other | Source: Ambulatory Visit | Attending: Acute Care | Admitting: Acute Care

## 2018-01-08 DIAGNOSIS — I700 Atherosclerosis of aorta: Secondary | ICD-10-CM | POA: Insufficient documentation

## 2018-01-08 DIAGNOSIS — Z122 Encounter for screening for malignant neoplasm of respiratory organs: Secondary | ICD-10-CM | POA: Diagnosis not present

## 2018-01-08 DIAGNOSIS — I2510 Atherosclerotic heart disease of native coronary artery without angina pectoris: Secondary | ICD-10-CM | POA: Diagnosis not present

## 2018-01-08 DIAGNOSIS — J432 Centrilobular emphysema: Secondary | ICD-10-CM | POA: Insufficient documentation

## 2018-01-08 DIAGNOSIS — F17210 Nicotine dependence, cigarettes, uncomplicated: Secondary | ICD-10-CM | POA: Diagnosis not present

## 2018-01-15 ENCOUNTER — Other Ambulatory Visit: Payer: Self-pay | Admitting: Acute Care

## 2018-01-15 DIAGNOSIS — F17210 Nicotine dependence, cigarettes, uncomplicated: Principal | ICD-10-CM

## 2018-01-15 DIAGNOSIS — Z122 Encounter for screening for malignant neoplasm of respiratory organs: Secondary | ICD-10-CM

## 2018-05-06 DIAGNOSIS — M545 Low back pain: Secondary | ICD-10-CM | POA: Diagnosis not present

## 2018-05-06 DIAGNOSIS — M9903 Segmental and somatic dysfunction of lumbar region: Secondary | ICD-10-CM | POA: Diagnosis not present

## 2018-05-06 DIAGNOSIS — M62830 Muscle spasm of back: Secondary | ICD-10-CM | POA: Diagnosis not present

## 2018-05-06 DIAGNOSIS — M5136 Other intervertebral disc degeneration, lumbar region: Secondary | ICD-10-CM | POA: Diagnosis not present

## 2018-05-11 DIAGNOSIS — H40053 Ocular hypertension, bilateral: Secondary | ICD-10-CM | POA: Diagnosis not present

## 2018-05-20 DIAGNOSIS — M5136 Other intervertebral disc degeneration, lumbar region: Secondary | ICD-10-CM | POA: Diagnosis not present

## 2018-05-20 DIAGNOSIS — M62830 Muscle spasm of back: Secondary | ICD-10-CM | POA: Diagnosis not present

## 2018-05-20 DIAGNOSIS — M9903 Segmental and somatic dysfunction of lumbar region: Secondary | ICD-10-CM | POA: Diagnosis not present

## 2018-05-20 DIAGNOSIS — M545 Low back pain: Secondary | ICD-10-CM | POA: Diagnosis not present

## 2018-06-16 DIAGNOSIS — H31002 Unspecified chorioretinal scars, left eye: Secondary | ICD-10-CM | POA: Diagnosis not present

## 2018-06-16 DIAGNOSIS — H25813 Combined forms of age-related cataract, bilateral: Secondary | ICD-10-CM | POA: Diagnosis not present

## 2018-06-16 DIAGNOSIS — H40053 Ocular hypertension, bilateral: Secondary | ICD-10-CM | POA: Diagnosis not present

## 2018-06-24 DIAGNOSIS — M5136 Other intervertebral disc degeneration, lumbar region: Secondary | ICD-10-CM | POA: Diagnosis not present

## 2018-06-24 DIAGNOSIS — M62830 Muscle spasm of back: Secondary | ICD-10-CM | POA: Diagnosis not present

## 2018-06-24 DIAGNOSIS — M9903 Segmental and somatic dysfunction of lumbar region: Secondary | ICD-10-CM | POA: Diagnosis not present

## 2018-06-24 DIAGNOSIS — M545 Low back pain: Secondary | ICD-10-CM | POA: Diagnosis not present

## 2018-07-08 DIAGNOSIS — M9903 Segmental and somatic dysfunction of lumbar region: Secondary | ICD-10-CM | POA: Diagnosis not present

## 2018-07-08 DIAGNOSIS — M545 Low back pain: Secondary | ICD-10-CM | POA: Diagnosis not present

## 2018-07-08 DIAGNOSIS — M5136 Other intervertebral disc degeneration, lumbar region: Secondary | ICD-10-CM | POA: Diagnosis not present

## 2018-07-08 DIAGNOSIS — M62830 Muscle spasm of back: Secondary | ICD-10-CM | POA: Diagnosis not present

## 2018-07-12 DIAGNOSIS — N390 Urinary tract infection, site not specified: Secondary | ICD-10-CM | POA: Diagnosis not present

## 2018-08-12 DIAGNOSIS — M5136 Other intervertebral disc degeneration, lumbar region: Secondary | ICD-10-CM | POA: Diagnosis not present

## 2018-08-12 DIAGNOSIS — M9903 Segmental and somatic dysfunction of lumbar region: Secondary | ICD-10-CM | POA: Diagnosis not present

## 2018-08-12 DIAGNOSIS — M545 Low back pain: Secondary | ICD-10-CM | POA: Diagnosis not present

## 2018-08-12 DIAGNOSIS — M62830 Muscle spasm of back: Secondary | ICD-10-CM | POA: Diagnosis not present

## 2018-08-25 DIAGNOSIS — M545 Low back pain: Secondary | ICD-10-CM | POA: Diagnosis not present

## 2018-08-25 DIAGNOSIS — J449 Chronic obstructive pulmonary disease, unspecified: Secondary | ICD-10-CM | POA: Diagnosis not present

## 2018-08-25 DIAGNOSIS — Z23 Encounter for immunization: Secondary | ICD-10-CM | POA: Diagnosis not present

## 2018-08-25 DIAGNOSIS — F321 Major depressive disorder, single episode, moderate: Secondary | ICD-10-CM | POA: Diagnosis not present

## 2018-08-25 DIAGNOSIS — I10 Essential (primary) hypertension: Secondary | ICD-10-CM | POA: Diagnosis not present

## 2018-09-09 DIAGNOSIS — M545 Low back pain: Secondary | ICD-10-CM | POA: Diagnosis not present

## 2018-09-09 DIAGNOSIS — M5136 Other intervertebral disc degeneration, lumbar region: Secondary | ICD-10-CM | POA: Diagnosis not present

## 2018-09-09 DIAGNOSIS — M9903 Segmental and somatic dysfunction of lumbar region: Secondary | ICD-10-CM | POA: Diagnosis not present

## 2018-09-09 DIAGNOSIS — M62830 Muscle spasm of back: Secondary | ICD-10-CM | POA: Diagnosis not present

## 2018-10-14 DIAGNOSIS — M62830 Muscle spasm of back: Secondary | ICD-10-CM | POA: Diagnosis not present

## 2018-10-14 DIAGNOSIS — M545 Low back pain: Secondary | ICD-10-CM | POA: Diagnosis not present

## 2018-10-14 DIAGNOSIS — M5136 Other intervertebral disc degeneration, lumbar region: Secondary | ICD-10-CM | POA: Diagnosis not present

## 2018-10-14 DIAGNOSIS — M9903 Segmental and somatic dysfunction of lumbar region: Secondary | ICD-10-CM | POA: Diagnosis not present

## 2018-10-28 DIAGNOSIS — M62830 Muscle spasm of back: Secondary | ICD-10-CM | POA: Diagnosis not present

## 2018-10-28 DIAGNOSIS — M9903 Segmental and somatic dysfunction of lumbar region: Secondary | ICD-10-CM | POA: Diagnosis not present

## 2018-10-28 DIAGNOSIS — M5136 Other intervertebral disc degeneration, lumbar region: Secondary | ICD-10-CM | POA: Diagnosis not present

## 2018-10-28 DIAGNOSIS — M545 Low back pain: Secondary | ICD-10-CM | POA: Diagnosis not present

## 2018-11-17 DIAGNOSIS — M545 Low back pain: Secondary | ICD-10-CM | POA: Diagnosis not present

## 2018-11-17 DIAGNOSIS — M5136 Other intervertebral disc degeneration, lumbar region: Secondary | ICD-10-CM | POA: Diagnosis not present

## 2018-11-17 DIAGNOSIS — M9903 Segmental and somatic dysfunction of lumbar region: Secondary | ICD-10-CM | POA: Diagnosis not present

## 2018-11-17 DIAGNOSIS — M62830 Muscle spasm of back: Secondary | ICD-10-CM | POA: Diagnosis not present

## 2018-12-02 DIAGNOSIS — M545 Low back pain: Secondary | ICD-10-CM | POA: Diagnosis not present

## 2018-12-02 DIAGNOSIS — M9903 Segmental and somatic dysfunction of lumbar region: Secondary | ICD-10-CM | POA: Diagnosis not present

## 2018-12-02 DIAGNOSIS — M5136 Other intervertebral disc degeneration, lumbar region: Secondary | ICD-10-CM | POA: Diagnosis not present

## 2018-12-02 DIAGNOSIS — M62830 Muscle spasm of back: Secondary | ICD-10-CM | POA: Diagnosis not present

## 2018-12-22 DIAGNOSIS — H40053 Ocular hypertension, bilateral: Secondary | ICD-10-CM | POA: Diagnosis not present

## 2018-12-22 DIAGNOSIS — H31002 Unspecified chorioretinal scars, left eye: Secondary | ICD-10-CM | POA: Diagnosis not present

## 2018-12-22 DIAGNOSIS — H25813 Combined forms of age-related cataract, bilateral: Secondary | ICD-10-CM | POA: Diagnosis not present

## 2019-01-06 DIAGNOSIS — M9903 Segmental and somatic dysfunction of lumbar region: Secondary | ICD-10-CM | POA: Diagnosis not present

## 2019-01-06 DIAGNOSIS — M545 Low back pain: Secondary | ICD-10-CM | POA: Diagnosis not present

## 2019-01-06 DIAGNOSIS — M5136 Other intervertebral disc degeneration, lumbar region: Secondary | ICD-10-CM | POA: Diagnosis not present

## 2019-01-06 DIAGNOSIS — M62830 Muscle spasm of back: Secondary | ICD-10-CM | POA: Diagnosis not present

## 2019-01-10 ENCOUNTER — Ambulatory Visit (HOSPITAL_COMMUNITY)
Admission: RE | Admit: 2019-01-10 | Discharge: 2019-01-10 | Disposition: A | Discharge status: Home / Self Care | Payer: Medicare Other | Source: Ambulatory Visit | Attending: Acute Care | Admitting: Acute Care

## 2019-01-10 DIAGNOSIS — F17210 Nicotine dependence, cigarettes, uncomplicated: Secondary | ICD-10-CM | POA: Diagnosis not present

## 2019-01-10 DIAGNOSIS — Z122 Encounter for screening for malignant neoplasm of respiratory organs: Secondary | ICD-10-CM | POA: Diagnosis not present

## 2019-01-14 ENCOUNTER — Other Ambulatory Visit: Payer: Self-pay | Admitting: Acute Care

## 2019-01-14 DIAGNOSIS — F17210 Nicotine dependence, cigarettes, uncomplicated: Principal | ICD-10-CM

## 2019-01-14 DIAGNOSIS — Z87891 Personal history of nicotine dependence: Secondary | ICD-10-CM

## 2019-01-14 DIAGNOSIS — Z122 Encounter for screening for malignant neoplasm of respiratory organs: Secondary | ICD-10-CM

## 2019-01-21 DIAGNOSIS — H40053 Ocular hypertension, bilateral: Secondary | ICD-10-CM | POA: Diagnosis not present

## 2019-01-21 DIAGNOSIS — H25813 Combined forms of age-related cataract, bilateral: Secondary | ICD-10-CM | POA: Diagnosis not present

## 2019-01-21 DIAGNOSIS — H31002 Unspecified chorioretinal scars, left eye: Secondary | ICD-10-CM | POA: Diagnosis not present

## 2019-02-03 DIAGNOSIS — M5136 Other intervertebral disc degeneration, lumbar region: Secondary | ICD-10-CM | POA: Diagnosis not present

## 2019-02-03 DIAGNOSIS — M62830 Muscle spasm of back: Secondary | ICD-10-CM | POA: Diagnosis not present

## 2019-02-03 DIAGNOSIS — M9903 Segmental and somatic dysfunction of lumbar region: Secondary | ICD-10-CM | POA: Diagnosis not present

## 2019-02-03 DIAGNOSIS — M545 Low back pain: Secondary | ICD-10-CM | POA: Diagnosis not present

## 2019-02-17 DIAGNOSIS — M5136 Other intervertebral disc degeneration, lumbar region: Secondary | ICD-10-CM | POA: Diagnosis not present

## 2019-02-17 DIAGNOSIS — M545 Low back pain: Secondary | ICD-10-CM | POA: Diagnosis not present

## 2019-02-17 DIAGNOSIS — M62830 Muscle spasm of back: Secondary | ICD-10-CM | POA: Diagnosis not present

## 2019-02-17 DIAGNOSIS — M9903 Segmental and somatic dysfunction of lumbar region: Secondary | ICD-10-CM | POA: Diagnosis not present

## 2019-03-03 DIAGNOSIS — M5136 Other intervertebral disc degeneration, lumbar region: Secondary | ICD-10-CM | POA: Diagnosis not present

## 2019-03-03 DIAGNOSIS — M9903 Segmental and somatic dysfunction of lumbar region: Secondary | ICD-10-CM | POA: Diagnosis not present

## 2019-03-03 DIAGNOSIS — M62830 Muscle spasm of back: Secondary | ICD-10-CM | POA: Diagnosis not present

## 2019-03-03 DIAGNOSIS — M545 Low back pain: Secondary | ICD-10-CM | POA: Diagnosis not present

## 2019-04-07 DIAGNOSIS — M545 Low back pain: Secondary | ICD-10-CM | POA: Diagnosis not present

## 2019-04-07 DIAGNOSIS — M9903 Segmental and somatic dysfunction of lumbar region: Secondary | ICD-10-CM | POA: Diagnosis not present

## 2019-04-07 DIAGNOSIS — M62830 Muscle spasm of back: Secondary | ICD-10-CM | POA: Diagnosis not present

## 2019-04-07 DIAGNOSIS — M5136 Other intervertebral disc degeneration, lumbar region: Secondary | ICD-10-CM | POA: Diagnosis not present

## 2019-04-21 DIAGNOSIS — M545 Low back pain: Secondary | ICD-10-CM | POA: Diagnosis not present

## 2019-04-21 DIAGNOSIS — M5136 Other intervertebral disc degeneration, lumbar region: Secondary | ICD-10-CM | POA: Diagnosis not present

## 2019-04-21 DIAGNOSIS — M62830 Muscle spasm of back: Secondary | ICD-10-CM | POA: Diagnosis not present

## 2019-04-21 DIAGNOSIS — M9903 Segmental and somatic dysfunction of lumbar region: Secondary | ICD-10-CM | POA: Diagnosis not present

## 2019-05-05 DIAGNOSIS — M5136 Other intervertebral disc degeneration, lumbar region: Secondary | ICD-10-CM | POA: Diagnosis not present

## 2019-05-05 DIAGNOSIS — M545 Low back pain: Secondary | ICD-10-CM | POA: Diagnosis not present

## 2019-05-05 DIAGNOSIS — M9903 Segmental and somatic dysfunction of lumbar region: Secondary | ICD-10-CM | POA: Diagnosis not present

## 2019-05-05 DIAGNOSIS — M62830 Muscle spasm of back: Secondary | ICD-10-CM | POA: Diagnosis not present

## 2019-05-19 DIAGNOSIS — M545 Low back pain: Secondary | ICD-10-CM | POA: Diagnosis not present

## 2019-05-19 DIAGNOSIS — M9903 Segmental and somatic dysfunction of lumbar region: Secondary | ICD-10-CM | POA: Diagnosis not present

## 2019-05-19 DIAGNOSIS — M62830 Muscle spasm of back: Secondary | ICD-10-CM | POA: Diagnosis not present

## 2019-05-19 DIAGNOSIS — M5136 Other intervertebral disc degeneration, lumbar region: Secondary | ICD-10-CM | POA: Diagnosis not present

## 2019-06-02 DIAGNOSIS — M62830 Muscle spasm of back: Secondary | ICD-10-CM | POA: Diagnosis not present

## 2019-06-02 DIAGNOSIS — M545 Low back pain: Secondary | ICD-10-CM | POA: Diagnosis not present

## 2019-06-02 DIAGNOSIS — M5136 Other intervertebral disc degeneration, lumbar region: Secondary | ICD-10-CM | POA: Diagnosis not present

## 2019-06-02 DIAGNOSIS — M9903 Segmental and somatic dysfunction of lumbar region: Secondary | ICD-10-CM | POA: Diagnosis not present

## 2019-06-22 DIAGNOSIS — H25813 Combined forms of age-related cataract, bilateral: Secondary | ICD-10-CM | POA: Diagnosis not present

## 2019-06-22 DIAGNOSIS — H40053 Ocular hypertension, bilateral: Secondary | ICD-10-CM | POA: Diagnosis not present

## 2019-06-22 DIAGNOSIS — H31002 Unspecified chorioretinal scars, left eye: Secondary | ICD-10-CM | POA: Diagnosis not present

## 2019-07-14 DIAGNOSIS — M545 Low back pain: Secondary | ICD-10-CM | POA: Diagnosis not present

## 2019-07-14 DIAGNOSIS — M5136 Other intervertebral disc degeneration, lumbar region: Secondary | ICD-10-CM | POA: Diagnosis not present

## 2019-07-14 DIAGNOSIS — M9903 Segmental and somatic dysfunction of lumbar region: Secondary | ICD-10-CM | POA: Diagnosis not present

## 2019-07-14 DIAGNOSIS — M62830 Muscle spasm of back: Secondary | ICD-10-CM | POA: Diagnosis not present

## 2019-10-11 DIAGNOSIS — M62830 Muscle spasm of back: Secondary | ICD-10-CM | POA: Diagnosis not present

## 2019-10-11 DIAGNOSIS — M5136 Other intervertebral disc degeneration, lumbar region: Secondary | ICD-10-CM | POA: Diagnosis not present

## 2019-10-11 DIAGNOSIS — M545 Low back pain: Secondary | ICD-10-CM | POA: Diagnosis not present

## 2019-10-11 DIAGNOSIS — M9903 Segmental and somatic dysfunction of lumbar region: Secondary | ICD-10-CM | POA: Diagnosis not present

## 2019-10-17 ENCOUNTER — Ambulatory Visit (HOSPITAL_COMMUNITY)
Admission: RE | Admit: 2019-10-17 | Discharge: 2019-10-17 | Disposition: A | Discharge status: Home / Self Care | Payer: Medicare Other | Source: Ambulatory Visit | Attending: Pulmonary Disease | Admitting: Pulmonary Disease

## 2019-10-17 ENCOUNTER — Encounter (HOSPITAL_COMMUNITY): Payer: Self-pay

## 2019-10-17 ENCOUNTER — Other Ambulatory Visit: Payer: Self-pay

## 2019-10-17 ENCOUNTER — Other Ambulatory Visit (HOSPITAL_COMMUNITY): Payer: Self-pay | Admitting: Pulmonary Disease

## 2019-10-17 DIAGNOSIS — S2231XA Fracture of one rib, right side, initial encounter for closed fracture: Secondary | ICD-10-CM | POA: Diagnosis not present

## 2019-10-17 DIAGNOSIS — R0781 Pleurodynia: Secondary | ICD-10-CM

## 2019-10-17 DIAGNOSIS — R079 Chest pain, unspecified: Secondary | ICD-10-CM | POA: Diagnosis not present

## 2020-01-17 ENCOUNTER — Ambulatory Visit (HOSPITAL_COMMUNITY)
Admission: RE | Admit: 2020-01-17 | Discharge: 2020-01-17 | Disposition: A | Discharge status: Home / Self Care | Payer: Medicare Other | Source: Ambulatory Visit | Attending: Acute Care | Admitting: Acute Care

## 2020-01-17 ENCOUNTER — Other Ambulatory Visit: Payer: Self-pay

## 2020-01-17 DIAGNOSIS — F17200 Nicotine dependence, unspecified, uncomplicated: Secondary | ICD-10-CM | POA: Diagnosis not present

## 2020-01-17 DIAGNOSIS — Z87891 Personal history of nicotine dependence: Secondary | ICD-10-CM | POA: Diagnosis not present

## 2020-01-17 DIAGNOSIS — F17210 Nicotine dependence, cigarettes, uncomplicated: Secondary | ICD-10-CM | POA: Diagnosis not present

## 2020-01-17 DIAGNOSIS — Z122 Encounter for screening for malignant neoplasm of respiratory organs: Secondary | ICD-10-CM | POA: Diagnosis not present

## 2020-01-18 NOTE — Progress Notes (Signed)
Please call patient and let them  know their  low dose Ct was read as a Lung RADS 2: nodules that are benign in appearance and behavior with a very low likelihood of becoming a clinically active cancer due to size or lack of growth. Recommendation per radiology is for a repeat LDCT in 12 months. .Please let them  know we will order and schedule their  annual screening scan for 01/2021. Please let them  know there was notation of CAD on their  scan.  Please remind the patient  that this is a non-gated exam therefore degree or severity of disease  cannot be determined. Please have them  follow up with their PCP regarding potential risk factor modification, dietary therapy or pharmacologic therapy if clinically indicated. Pt.  is  currently on statin therapy. Please place order for annual  screening scan for  01/2021 and fax results to PCP. Thanks so much.  Please let her know her CT shows she has emphysema. Thanks

## 2020-01-19 ENCOUNTER — Other Ambulatory Visit: Payer: Self-pay | Admitting: *Deleted

## 2020-01-19 DIAGNOSIS — Z87891 Personal history of nicotine dependence: Secondary | ICD-10-CM

## 2020-01-19 DIAGNOSIS — F17210 Nicotine dependence, cigarettes, uncomplicated: Secondary | ICD-10-CM

## 2020-02-06 IMAGING — DX DG RIBS W/ CHEST 3+V*R*
4 series · 4 of 4 positions shown · non-contrast
Comparison: CT 01/10/2019, radiograph 07/28/2016

CLINICAL DATA: Right rib pain, chest pain

EXAM:
RIGHT RIBS AND CHEST - 3+ VIEW

[chest pa]
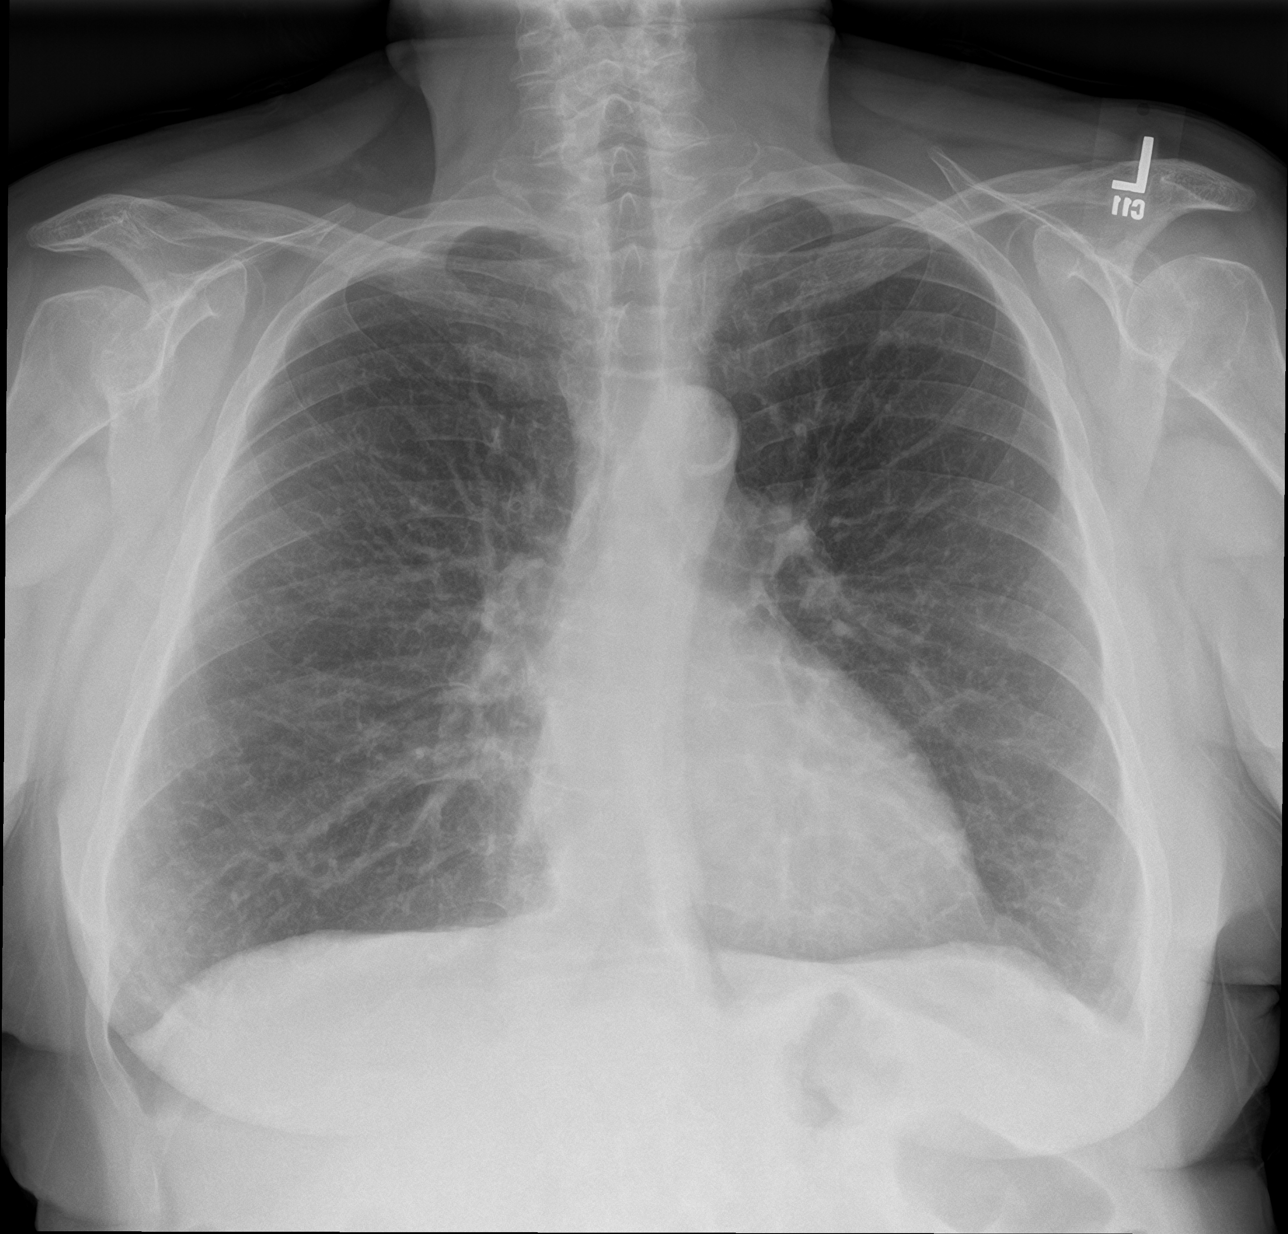

[rib pa]
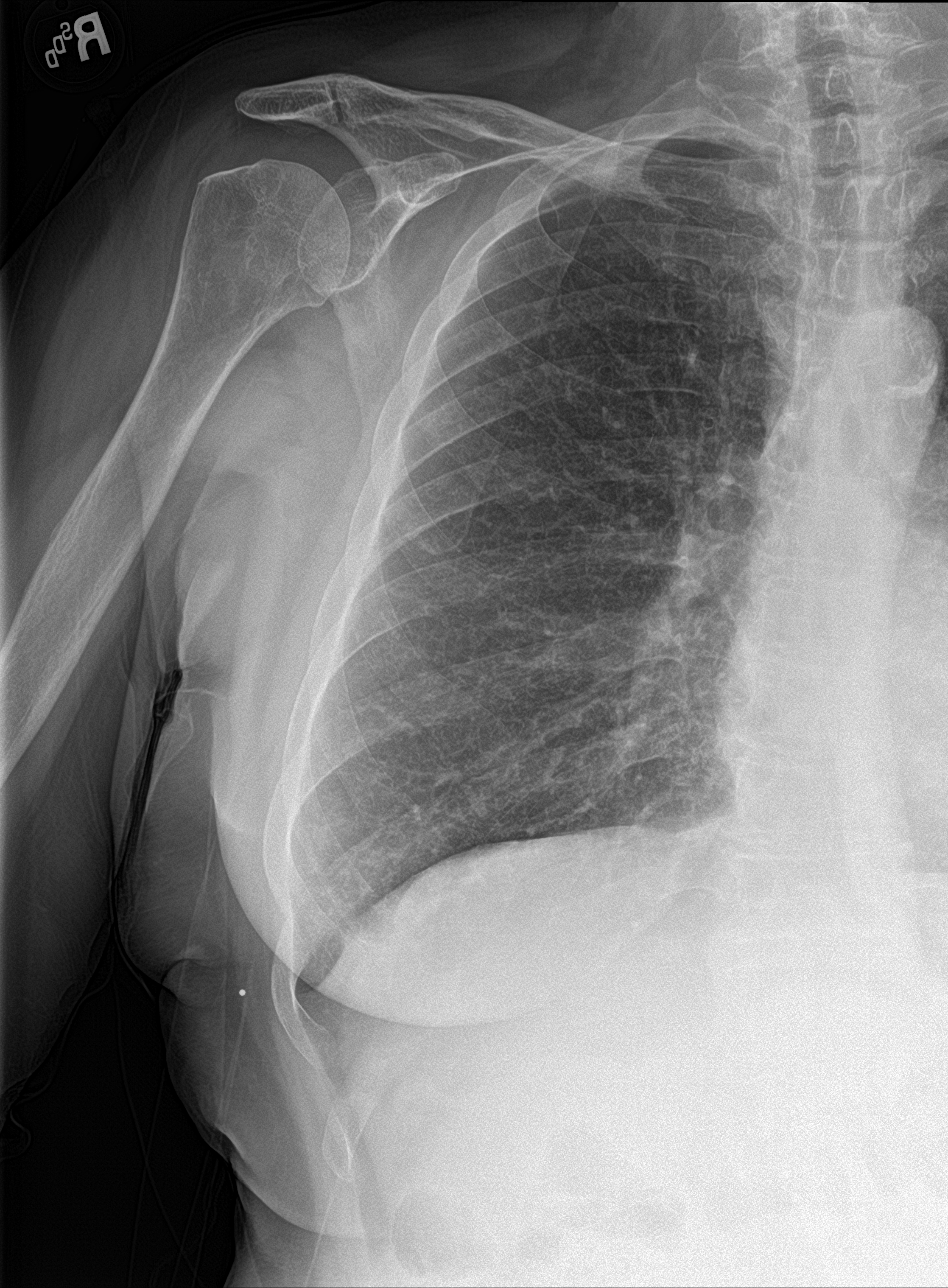

[rib pa obl (1 of 2)]
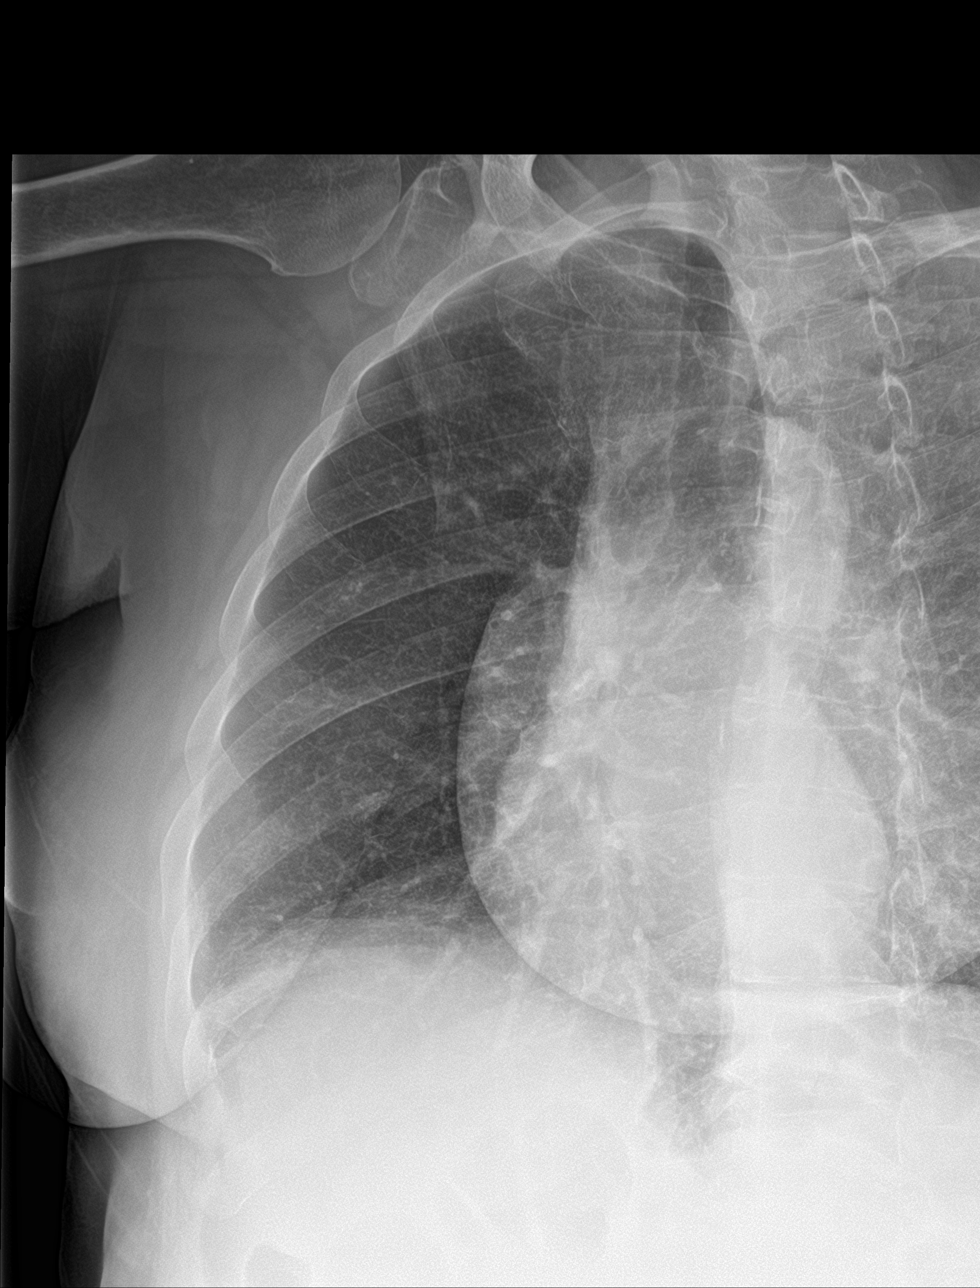

[rib pa obl (2 of 2)]
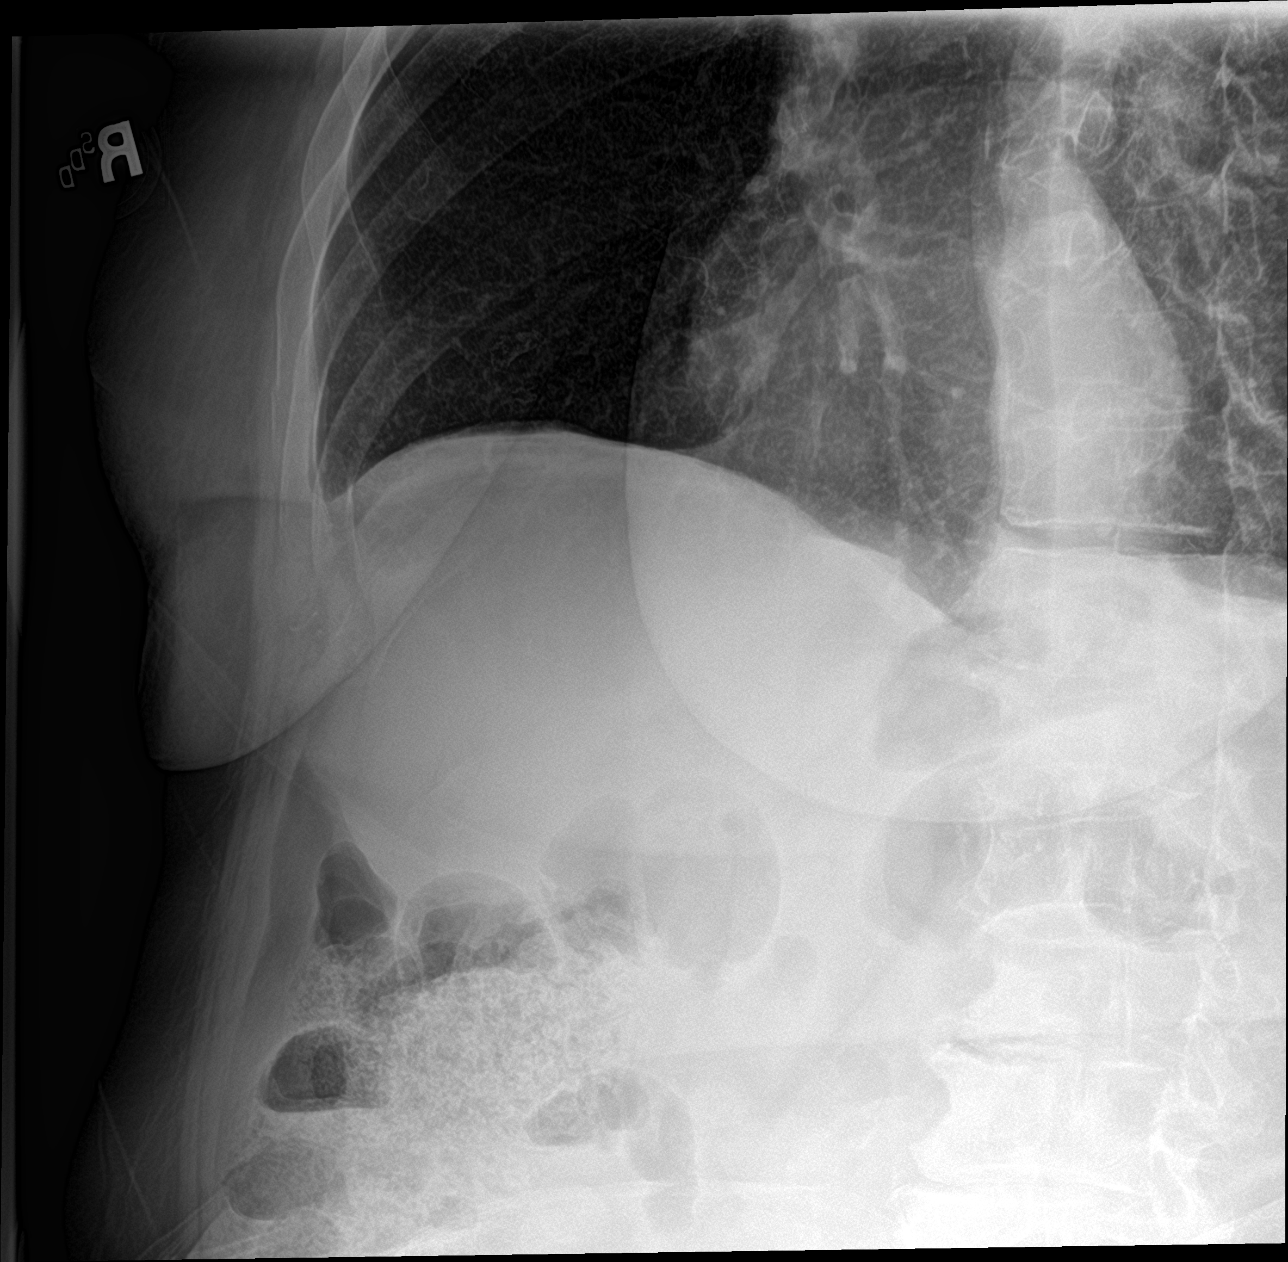

[4 of 4 positions shown; findings below may reference images not displayed]

FINDINGS: Minimally displaced right seventh lateral rib fracture. No other
visible displaced rib fractures. Blunting of the costophrenic sulcus
could reflect trace amount of fluid. No pneumothorax. Coarse
interstitial and bronchitic changes are similar to comparison exams.
No consolidation or features of edema. Atherosclerotic plaque within
the normal caliber aorta. Mild dextrocurvature of the thoracic spine
with multilevel degenerative changes.
IMPRESSION: 1. Minimally displaced right seventh lateral rib fracture. No other
visible displaced rib fractures.
2. Possible trace amount of fluid in the right costophrenic sulcus.

## 2020-03-05 DIAGNOSIS — E785 Hyperlipidemia, unspecified: Secondary | ICD-10-CM | POA: Diagnosis not present

## 2020-03-05 DIAGNOSIS — Z79899 Other long term (current) drug therapy: Secondary | ICD-10-CM | POA: Diagnosis not present

## 2020-03-05 DIAGNOSIS — F419 Anxiety disorder, unspecified: Secondary | ICD-10-CM | POA: Diagnosis not present

## 2020-03-05 DIAGNOSIS — E538 Deficiency of other specified B group vitamins: Secondary | ICD-10-CM | POA: Diagnosis not present

## 2020-03-05 DIAGNOSIS — J449 Chronic obstructive pulmonary disease, unspecified: Secondary | ICD-10-CM | POA: Diagnosis not present

## 2020-03-05 DIAGNOSIS — I10 Essential (primary) hypertension: Secondary | ICD-10-CM | POA: Diagnosis not present

## 2020-03-15 ENCOUNTER — Other Ambulatory Visit (HOSPITAL_COMMUNITY): Payer: Self-pay | Admitting: Internal Medicine

## 2020-03-15 DIAGNOSIS — J449 Chronic obstructive pulmonary disease, unspecified: Secondary | ICD-10-CM | POA: Diagnosis not present

## 2020-03-15 DIAGNOSIS — Z1231 Encounter for screening mammogram for malignant neoplasm of breast: Secondary | ICD-10-CM

## 2020-03-15 DIAGNOSIS — I10 Essential (primary) hypertension: Secondary | ICD-10-CM | POA: Diagnosis not present

## 2020-03-15 DIAGNOSIS — I700 Atherosclerosis of aorta: Secondary | ICD-10-CM | POA: Diagnosis not present

## 2020-03-15 DIAGNOSIS — F3340 Major depressive disorder, recurrent, in remission, unspecified: Secondary | ICD-10-CM | POA: Diagnosis not present

## 2020-03-15 DIAGNOSIS — E785 Hyperlipidemia, unspecified: Secondary | ICD-10-CM | POA: Diagnosis not present

## 2020-03-22 ENCOUNTER — Ambulatory Visit (HOSPITAL_COMMUNITY)
Admission: RE | Admit: 2020-03-22 | Discharge: 2020-03-22 | Disposition: A | Discharge status: Home / Self Care | Payer: Medicare Other | Source: Ambulatory Visit | Attending: Internal Medicine | Admitting: Internal Medicine

## 2020-03-22 ENCOUNTER — Other Ambulatory Visit: Payer: Self-pay

## 2020-03-22 DIAGNOSIS — Z1231 Encounter for screening mammogram for malignant neoplasm of breast: Secondary | ICD-10-CM | POA: Diagnosis not present

## 2020-03-26 ENCOUNTER — Other Ambulatory Visit (HOSPITAL_COMMUNITY): Payer: Self-pay | Admitting: Internal Medicine

## 2020-03-26 DIAGNOSIS — R928 Other abnormal and inconclusive findings on diagnostic imaging of breast: Secondary | ICD-10-CM

## 2020-04-03 DIAGNOSIS — H40022 Open angle with borderline findings, high risk, left eye: Secondary | ICD-10-CM | POA: Diagnosis not present

## 2020-04-03 DIAGNOSIS — H25813 Combined forms of age-related cataract, bilateral: Secondary | ICD-10-CM | POA: Diagnosis not present

## 2020-04-03 DIAGNOSIS — H40021 Open angle with borderline findings, high risk, right eye: Secondary | ICD-10-CM | POA: Diagnosis not present

## 2020-04-03 DIAGNOSIS — H40053 Ocular hypertension, bilateral: Secondary | ICD-10-CM | POA: Diagnosis not present

## 2020-04-03 DIAGNOSIS — H31002 Unspecified chorioretinal scars, left eye: Secondary | ICD-10-CM | POA: Diagnosis not present

## 2020-04-10 ENCOUNTER — Ambulatory Visit (HOSPITAL_COMMUNITY)
Admission: RE | Admit: 2020-04-10 | Discharge: 2020-04-10 | Disposition: A | Discharge status: Home / Self Care | Payer: Medicare Other | Source: Ambulatory Visit | Attending: Internal Medicine | Admitting: Internal Medicine

## 2020-04-10 ENCOUNTER — Other Ambulatory Visit: Payer: Self-pay

## 2020-04-10 DIAGNOSIS — R928 Other abnormal and inconclusive findings on diagnostic imaging of breast: Secondary | ICD-10-CM | POA: Insufficient documentation

## 2020-04-10 DIAGNOSIS — N6489 Other specified disorders of breast: Secondary | ICD-10-CM | POA: Diagnosis not present

## 2020-04-19 DIAGNOSIS — M5136 Other intervertebral disc degeneration, lumbar region: Secondary | ICD-10-CM | POA: Diagnosis not present

## 2020-04-19 DIAGNOSIS — M62830 Muscle spasm of back: Secondary | ICD-10-CM | POA: Diagnosis not present

## 2020-04-19 DIAGNOSIS — M545 Low back pain: Secondary | ICD-10-CM | POA: Diagnosis not present

## 2020-04-19 DIAGNOSIS — M9903 Segmental and somatic dysfunction of lumbar region: Secondary | ICD-10-CM | POA: Diagnosis not present

## 2020-04-26 DIAGNOSIS — M9903 Segmental and somatic dysfunction of lumbar region: Secondary | ICD-10-CM | POA: Diagnosis not present

## 2020-04-26 DIAGNOSIS — M62830 Muscle spasm of back: Secondary | ICD-10-CM | POA: Diagnosis not present

## 2020-04-26 DIAGNOSIS — M5136 Other intervertebral disc degeneration, lumbar region: Secondary | ICD-10-CM | POA: Diagnosis not present

## 2020-04-26 DIAGNOSIS — M545 Low back pain: Secondary | ICD-10-CM | POA: Diagnosis not present

## 2020-05-03 DIAGNOSIS — M5136 Other intervertebral disc degeneration, lumbar region: Secondary | ICD-10-CM | POA: Diagnosis not present

## 2020-05-03 DIAGNOSIS — M62830 Muscle spasm of back: Secondary | ICD-10-CM | POA: Diagnosis not present

## 2020-05-03 DIAGNOSIS — M545 Low back pain: Secondary | ICD-10-CM | POA: Diagnosis not present

## 2020-05-03 DIAGNOSIS — M9903 Segmental and somatic dysfunction of lumbar region: Secondary | ICD-10-CM | POA: Diagnosis not present

## 2020-05-21 DIAGNOSIS — H25811 Combined forms of age-related cataract, right eye: Secondary | ICD-10-CM | POA: Diagnosis not present

## 2020-05-21 DIAGNOSIS — F17200 Nicotine dependence, unspecified, uncomplicated: Secondary | ICD-10-CM | POA: Diagnosis not present

## 2020-05-21 DIAGNOSIS — H40021 Open angle with borderline findings, high risk, right eye: Secondary | ICD-10-CM | POA: Diagnosis not present

## 2020-05-21 DIAGNOSIS — J449 Chronic obstructive pulmonary disease, unspecified: Secondary | ICD-10-CM | POA: Diagnosis not present

## 2020-05-21 DIAGNOSIS — H40051 Ocular hypertension, right eye: Secondary | ICD-10-CM | POA: Diagnosis not present

## 2020-05-21 DIAGNOSIS — Z885 Allergy status to narcotic agent status: Secondary | ICD-10-CM | POA: Diagnosis not present

## 2020-05-21 DIAGNOSIS — I10 Essential (primary) hypertension: Secondary | ICD-10-CM | POA: Diagnosis not present

## 2020-05-21 DIAGNOSIS — Z888 Allergy status to other drugs, medicaments and biological substances status: Secondary | ICD-10-CM | POA: Diagnosis not present

## 2020-05-21 DIAGNOSIS — Z79899 Other long term (current) drug therapy: Secondary | ICD-10-CM | POA: Diagnosis not present

## 2020-05-29 DIAGNOSIS — H2511 Age-related nuclear cataract, right eye: Secondary | ICD-10-CM | POA: Diagnosis not present

## 2020-07-05 DIAGNOSIS — Z23 Encounter for immunization: Secondary | ICD-10-CM | POA: Diagnosis not present

## 2020-07-31 DIAGNOSIS — M10071 Idiopathic gout, right ankle and foot: Secondary | ICD-10-CM | POA: Diagnosis not present

## 2020-08-03 DIAGNOSIS — Z23 Encounter for immunization: Secondary | ICD-10-CM | POA: Diagnosis not present

## 2020-08-23 DIAGNOSIS — M9903 Segmental and somatic dysfunction of lumbar region: Secondary | ICD-10-CM | POA: Diagnosis not present

## 2020-08-23 DIAGNOSIS — M5136 Other intervertebral disc degeneration, lumbar region: Secondary | ICD-10-CM | POA: Diagnosis not present

## 2020-08-23 DIAGNOSIS — M62830 Muscle spasm of back: Secondary | ICD-10-CM | POA: Diagnosis not present

## 2020-08-23 DIAGNOSIS — M545 Low back pain: Secondary | ICD-10-CM | POA: Diagnosis not present

## 2020-08-28 ENCOUNTER — Other Ambulatory Visit (HOSPITAL_COMMUNITY): Payer: Self-pay | Admitting: Internal Medicine

## 2020-08-28 DIAGNOSIS — N63 Unspecified lump in unspecified breast: Secondary | ICD-10-CM

## 2020-08-31 DIAGNOSIS — H40021 Open angle with borderline findings, high risk, right eye: Secondary | ICD-10-CM | POA: Diagnosis not present

## 2020-08-31 DIAGNOSIS — H40022 Open angle with borderline findings, high risk, left eye: Secondary | ICD-10-CM | POA: Diagnosis not present

## 2020-08-31 DIAGNOSIS — H40053 Ocular hypertension, bilateral: Secondary | ICD-10-CM | POA: Diagnosis not present

## 2020-08-31 DIAGNOSIS — Z961 Presence of intraocular lens: Secondary | ICD-10-CM | POA: Diagnosis not present

## 2020-08-31 DIAGNOSIS — Z9841 Cataract extraction status, right eye: Secondary | ICD-10-CM | POA: Diagnosis not present

## 2020-08-31 DIAGNOSIS — H25812 Combined forms of age-related cataract, left eye: Secondary | ICD-10-CM | POA: Diagnosis not present

## 2020-08-31 DIAGNOSIS — H31002 Unspecified chorioretinal scars, left eye: Secondary | ICD-10-CM | POA: Diagnosis not present

## 2020-09-13 DIAGNOSIS — R05 Cough: Secondary | ICD-10-CM | POA: Diagnosis not present

## 2020-09-13 DIAGNOSIS — R0981 Nasal congestion: Secondary | ICD-10-CM | POA: Diagnosis not present

## 2020-09-13 DIAGNOSIS — R070 Pain in throat: Secondary | ICD-10-CM | POA: Diagnosis not present

## 2020-09-13 DIAGNOSIS — J3481 Nasal mucositis (ulcerative): Secondary | ICD-10-CM | POA: Diagnosis not present

## 2020-09-15 DIAGNOSIS — Z20828 Contact with and (suspected) exposure to other viral communicable diseases: Secondary | ICD-10-CM | POA: Diagnosis not present

## 2020-10-16 ENCOUNTER — Ambulatory Visit (HOSPITAL_COMMUNITY)
Admission: RE | Admit: 2020-10-16 | Discharge: 2020-10-16 | Disposition: A | Discharge status: Home / Self Care | Payer: Medicare Other | Source: Ambulatory Visit | Attending: Internal Medicine | Admitting: Internal Medicine

## 2020-10-16 ENCOUNTER — Other Ambulatory Visit: Payer: Self-pay

## 2020-10-16 DIAGNOSIS — N6311 Unspecified lump in the right breast, upper outer quadrant: Secondary | ICD-10-CM | POA: Diagnosis not present

## 2020-10-16 DIAGNOSIS — N63 Unspecified lump in unspecified breast: Secondary | ICD-10-CM | POA: Insufficient documentation

## 2021-02-04 ENCOUNTER — Other Ambulatory Visit: Payer: Self-pay | Admitting: *Deleted

## 2021-02-04 DIAGNOSIS — F17210 Nicotine dependence, cigarettes, uncomplicated: Secondary | ICD-10-CM

## 2021-02-04 DIAGNOSIS — Z87891 Personal history of nicotine dependence: Secondary | ICD-10-CM

## 2021-02-05 IMAGING — MG MM DIGITAL DIAGNOSTIC UNILAT*R* W/ TOMO W/ CAD
4 series · 4 of 12 positions shown · non-contrast
Comparison: Previous exams.

CLINICAL DATA: Short-term follow-up of a probably benign right
breast mass felt to represent an intra mammary lymph node.

EXAM:
DIGITAL DIAGNOSTIC UNILATERAL RIGHT MAMMOGRAM WITH TOMO AND CAD

[R CC synth-2D]
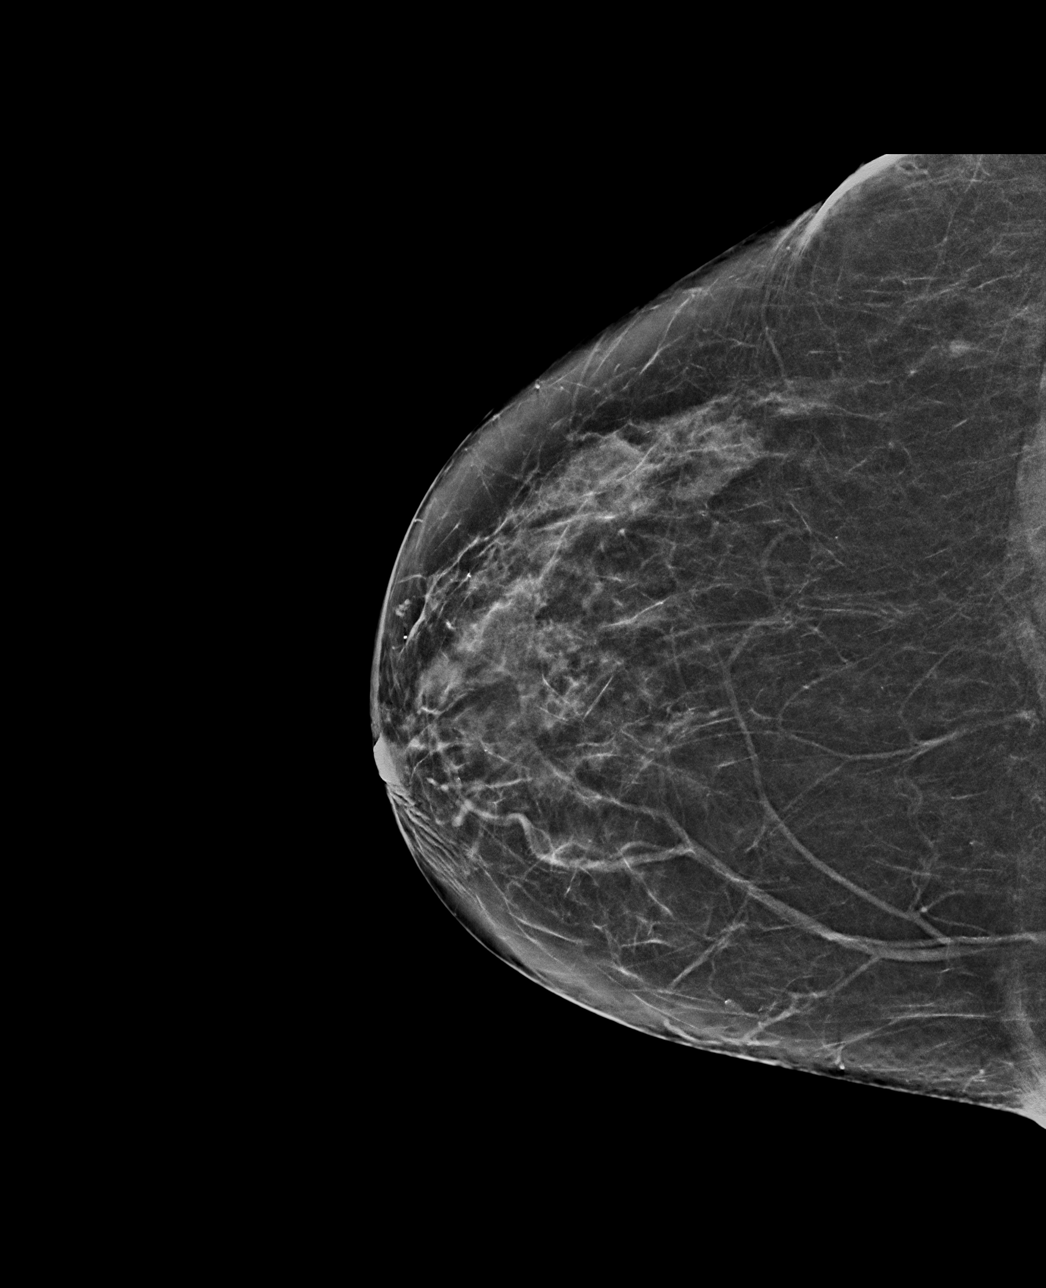

[R MLO synth-2D]
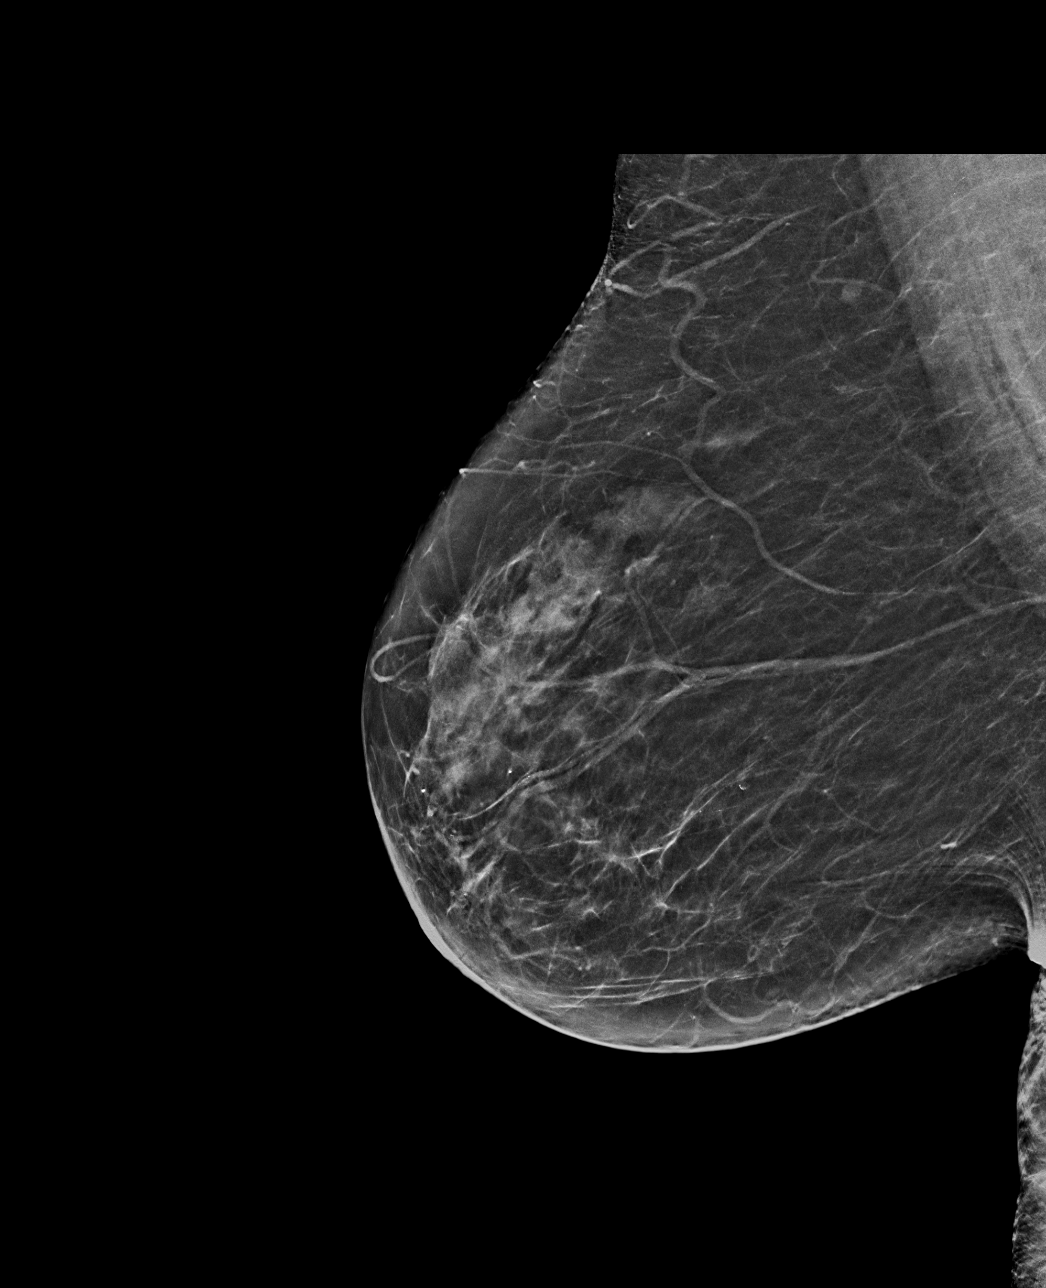

[R CC tomo · tomo slice 29/58.0]
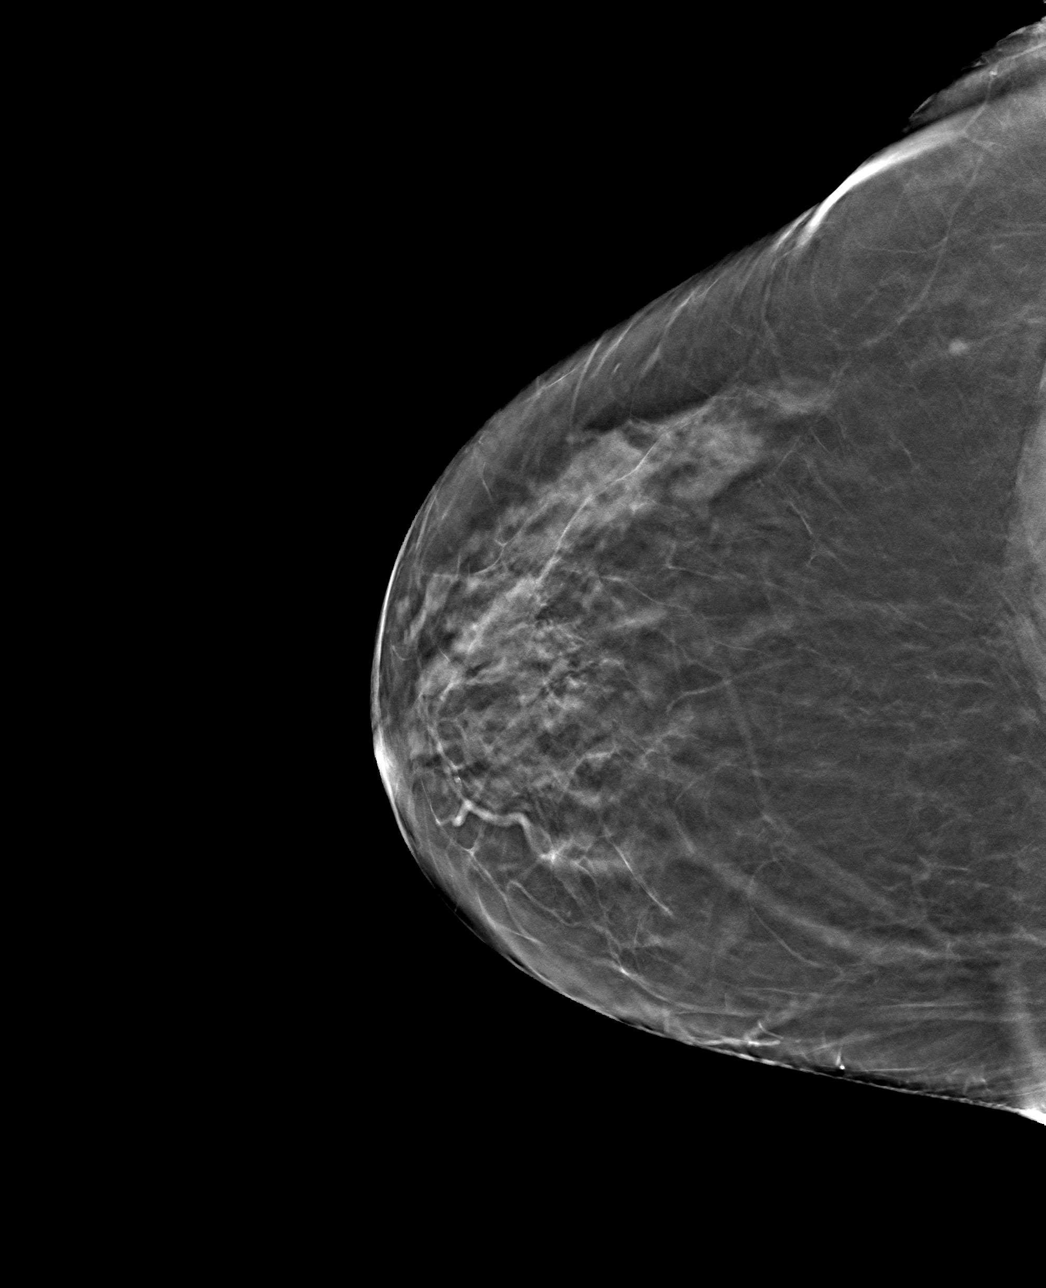

[R MLO tomo · tomo slice 29/58.0]
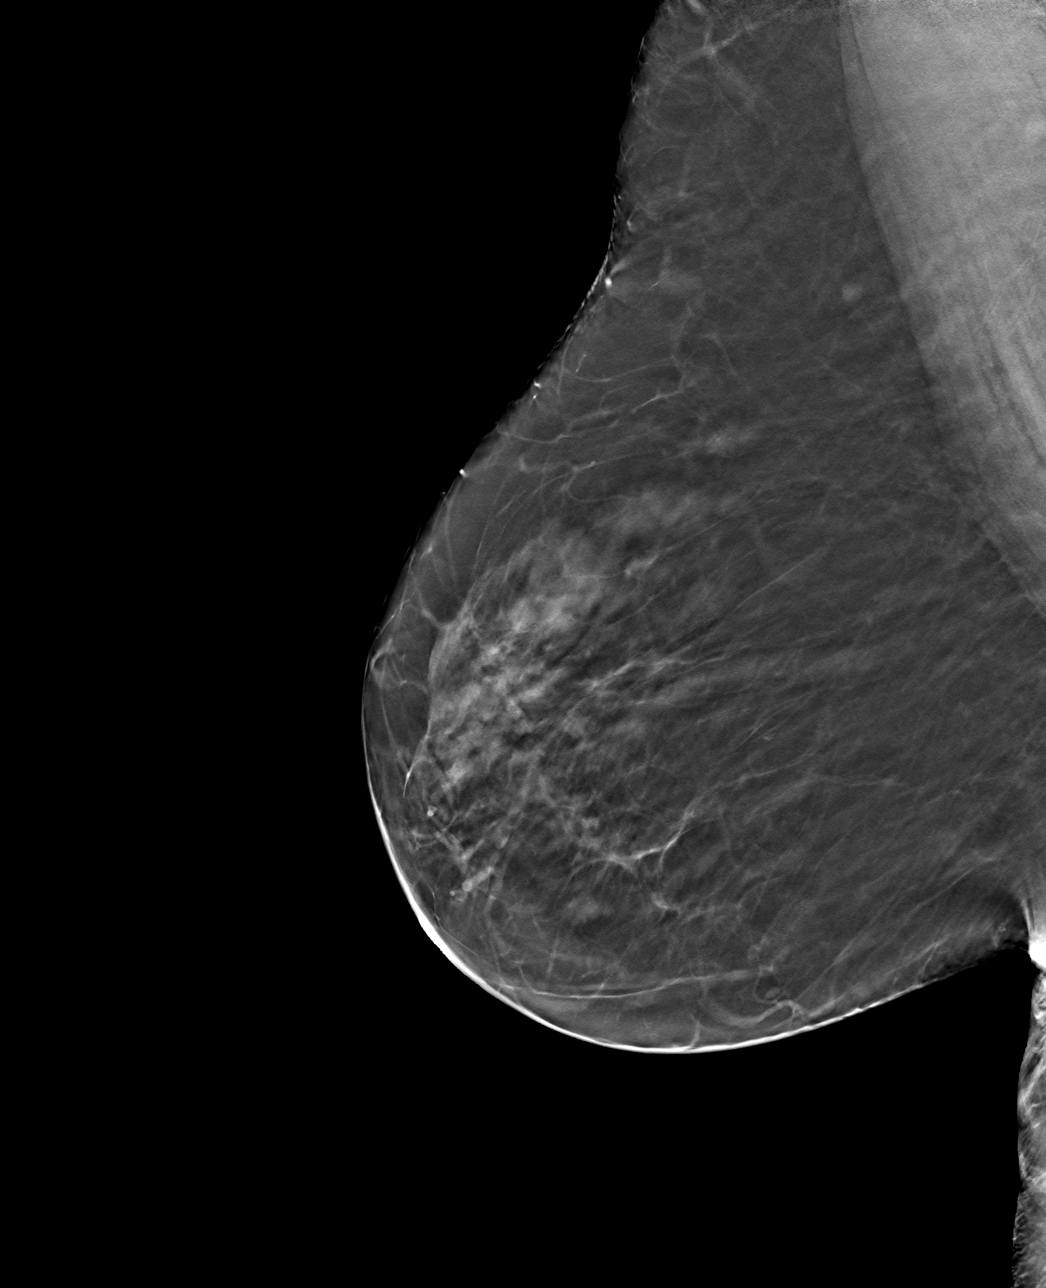

[4 of 12 positions shown; findings below may reference images not displayed]

ACR Breast Density Category c: The breast tissue is heterogeneously
dense, which may obscure small masses.
FINDINGS: No suspicious masses or calcifications are seen in the right breast.
The 3-4 mm oval mass in the upper-outer posterior right breast
appears unchanged. There is no mammographic evidence of malignancy
in the right breast.

Mammographic images were processed with CAD.
IMPRESSION: Stable probably benign right breast mass. No mammographic evidence
of malignancy in the right breast.

RECOMMENDATION:
Bilateral diagnostic mammography March 2021 which will demonstrate 1
year of stability of the probably benign right breast mass.

I have discussed the findings and recommendations with the patient.
If applicable, a reminder letter will be sent to the patient
regarding the next appointment.

BI-RADS CATEGORY  3: Probably benign.

## 2021-03-05 ENCOUNTER — Other Ambulatory Visit: Payer: Self-pay

## 2021-03-05 ENCOUNTER — Ambulatory Visit (HOSPITAL_COMMUNITY)
Admission: RE | Admit: 2021-03-05 | Discharge: 2021-03-05 | Disposition: A | Discharge status: Home / Self Care | Payer: Medicare Other | Source: Ambulatory Visit | Attending: Acute Care | Admitting: Acute Care

## 2021-03-05 DIAGNOSIS — F17210 Nicotine dependence, cigarettes, uncomplicated: Secondary | ICD-10-CM | POA: Insufficient documentation

## 2021-03-05 DIAGNOSIS — Z87891 Personal history of nicotine dependence: Secondary | ICD-10-CM | POA: Insufficient documentation

## 2021-03-14 DIAGNOSIS — M9903 Segmental and somatic dysfunction of lumbar region: Secondary | ICD-10-CM | POA: Diagnosis not present

## 2021-03-14 DIAGNOSIS — M5136 Other intervertebral disc degeneration, lumbar region: Secondary | ICD-10-CM | POA: Diagnosis not present

## 2021-03-14 DIAGNOSIS — M9904 Segmental and somatic dysfunction of sacral region: Secondary | ICD-10-CM | POA: Diagnosis not present

## 2021-03-14 DIAGNOSIS — M62830 Muscle spasm of back: Secondary | ICD-10-CM | POA: Diagnosis not present

## 2021-03-18 ENCOUNTER — Telehealth: Payer: Self-pay | Admitting: Acute Care

## 2021-03-18 DIAGNOSIS — Z87891 Personal history of nicotine dependence: Secondary | ICD-10-CM

## 2021-03-18 DIAGNOSIS — F17210 Nicotine dependence, cigarettes, uncomplicated: Secondary | ICD-10-CM

## 2021-03-18 NOTE — Telephone Encounter (Signed)
Pt informed of CT results per Eric Form, NP.  PT verbalized understanding.  Copy sent to PCP.  Order placed for 1 yr f/u CT. Pt has not seen a cardiologist in the past and will discuss this with Dr Willey Blade.

## 2021-03-19 DIAGNOSIS — M62830 Muscle spasm of back: Secondary | ICD-10-CM | POA: Diagnosis not present

## 2021-03-19 DIAGNOSIS — M9904 Segmental and somatic dysfunction of sacral region: Secondary | ICD-10-CM | POA: Diagnosis not present

## 2021-03-19 DIAGNOSIS — M9903 Segmental and somatic dysfunction of lumbar region: Secondary | ICD-10-CM | POA: Diagnosis not present

## 2021-03-19 DIAGNOSIS — M5136 Other intervertebral disc degeneration, lumbar region: Secondary | ICD-10-CM | POA: Diagnosis not present

## 2021-03-28 DIAGNOSIS — M9904 Segmental and somatic dysfunction of sacral region: Secondary | ICD-10-CM | POA: Diagnosis not present

## 2021-03-28 DIAGNOSIS — M9903 Segmental and somatic dysfunction of lumbar region: Secondary | ICD-10-CM | POA: Diagnosis not present

## 2021-03-28 DIAGNOSIS — M5136 Other intervertebral disc degeneration, lumbar region: Secondary | ICD-10-CM | POA: Diagnosis not present

## 2021-03-28 DIAGNOSIS — M62830 Muscle spasm of back: Secondary | ICD-10-CM | POA: Diagnosis not present

## 2021-04-02 NOTE — Progress Notes (Signed)
Results called to the patient by Doroteo Glassman RN on 4/4>> Please see telephone note Please have her follow up with PCP about CAD.  Lung RADS 2: nodules that are benign in appearance and behavior with a very low likelihood of becoming a clinically active cancer due to size or lack of growth. Recommendation per radiology is for a repeat LDCT in 12 months.

## 2021-04-04 DIAGNOSIS — M9903 Segmental and somatic dysfunction of lumbar region: Secondary | ICD-10-CM | POA: Diagnosis not present

## 2021-04-04 DIAGNOSIS — M62830 Muscle spasm of back: Secondary | ICD-10-CM | POA: Diagnosis not present

## 2021-04-04 DIAGNOSIS — M5136 Other intervertebral disc degeneration, lumbar region: Secondary | ICD-10-CM | POA: Diagnosis not present

## 2021-04-04 DIAGNOSIS — M9904 Segmental and somatic dysfunction of sacral region: Secondary | ICD-10-CM | POA: Diagnosis not present

## 2021-04-11 DIAGNOSIS — M9904 Segmental and somatic dysfunction of sacral region: Secondary | ICD-10-CM | POA: Diagnosis not present

## 2021-04-11 DIAGNOSIS — M62830 Muscle spasm of back: Secondary | ICD-10-CM | POA: Diagnosis not present

## 2021-04-11 DIAGNOSIS — M5136 Other intervertebral disc degeneration, lumbar region: Secondary | ICD-10-CM | POA: Diagnosis not present

## 2021-04-11 DIAGNOSIS — M9903 Segmental and somatic dysfunction of lumbar region: Secondary | ICD-10-CM | POA: Diagnosis not present

## 2021-04-18 DIAGNOSIS — M9904 Segmental and somatic dysfunction of sacral region: Secondary | ICD-10-CM | POA: Diagnosis not present

## 2021-04-18 DIAGNOSIS — M9903 Segmental and somatic dysfunction of lumbar region: Secondary | ICD-10-CM | POA: Diagnosis not present

## 2021-04-18 DIAGNOSIS — M5136 Other intervertebral disc degeneration, lumbar region: Secondary | ICD-10-CM | POA: Diagnosis not present

## 2021-04-18 DIAGNOSIS — M62830 Muscle spasm of back: Secondary | ICD-10-CM | POA: Diagnosis not present

## 2021-04-23 DIAGNOSIS — M62830 Muscle spasm of back: Secondary | ICD-10-CM | POA: Diagnosis not present

## 2021-04-23 DIAGNOSIS — M9903 Segmental and somatic dysfunction of lumbar region: Secondary | ICD-10-CM | POA: Diagnosis not present

## 2021-04-23 DIAGNOSIS — M5136 Other intervertebral disc degeneration, lumbar region: Secondary | ICD-10-CM | POA: Diagnosis not present

## 2021-04-23 DIAGNOSIS — M9904 Segmental and somatic dysfunction of sacral region: Secondary | ICD-10-CM | POA: Diagnosis not present

## 2021-05-02 DIAGNOSIS — M5136 Other intervertebral disc degeneration, lumbar region: Secondary | ICD-10-CM | POA: Diagnosis not present

## 2021-05-02 DIAGNOSIS — M62830 Muscle spasm of back: Secondary | ICD-10-CM | POA: Diagnosis not present

## 2021-05-02 DIAGNOSIS — M9904 Segmental and somatic dysfunction of sacral region: Secondary | ICD-10-CM | POA: Diagnosis not present

## 2021-05-02 DIAGNOSIS — M9903 Segmental and somatic dysfunction of lumbar region: Secondary | ICD-10-CM | POA: Diagnosis not present

## 2021-05-07 DIAGNOSIS — H40023 Open angle with borderline findings, high risk, bilateral: Secondary | ICD-10-CM | POA: Diagnosis not present

## 2021-05-09 DIAGNOSIS — M5136 Other intervertebral disc degeneration, lumbar region: Secondary | ICD-10-CM | POA: Diagnosis not present

## 2021-05-09 DIAGNOSIS — M62830 Muscle spasm of back: Secondary | ICD-10-CM | POA: Diagnosis not present

## 2021-05-09 DIAGNOSIS — M9903 Segmental and somatic dysfunction of lumbar region: Secondary | ICD-10-CM | POA: Diagnosis not present

## 2021-05-09 DIAGNOSIS — M9904 Segmental and somatic dysfunction of sacral region: Secondary | ICD-10-CM | POA: Diagnosis not present

## 2021-05-14 DIAGNOSIS — M62830 Muscle spasm of back: Secondary | ICD-10-CM | POA: Diagnosis not present

## 2021-05-14 DIAGNOSIS — M9904 Segmental and somatic dysfunction of sacral region: Secondary | ICD-10-CM | POA: Diagnosis not present

## 2021-05-14 DIAGNOSIS — M5136 Other intervertebral disc degeneration, lumbar region: Secondary | ICD-10-CM | POA: Diagnosis not present

## 2021-05-14 DIAGNOSIS — M9903 Segmental and somatic dysfunction of lumbar region: Secondary | ICD-10-CM | POA: Diagnosis not present

## 2021-05-16 DIAGNOSIS — M9904 Segmental and somatic dysfunction of sacral region: Secondary | ICD-10-CM | POA: Diagnosis not present

## 2021-05-16 DIAGNOSIS — M5136 Other intervertebral disc degeneration, lumbar region: Secondary | ICD-10-CM | POA: Diagnosis not present

## 2021-05-16 DIAGNOSIS — M62830 Muscle spasm of back: Secondary | ICD-10-CM | POA: Diagnosis not present

## 2021-05-16 DIAGNOSIS — M9903 Segmental and somatic dysfunction of lumbar region: Secondary | ICD-10-CM | POA: Diagnosis not present

## 2021-05-21 DIAGNOSIS — M5136 Other intervertebral disc degeneration, lumbar region: Secondary | ICD-10-CM | POA: Diagnosis not present

## 2021-05-21 DIAGNOSIS — M62830 Muscle spasm of back: Secondary | ICD-10-CM | POA: Diagnosis not present

## 2021-05-21 DIAGNOSIS — M9903 Segmental and somatic dysfunction of lumbar region: Secondary | ICD-10-CM | POA: Diagnosis not present

## 2021-05-21 DIAGNOSIS — M9904 Segmental and somatic dysfunction of sacral region: Secondary | ICD-10-CM | POA: Diagnosis not present

## 2021-05-24 DIAGNOSIS — M13852 Other specified arthritis, left hip: Secondary | ICD-10-CM | POA: Diagnosis not present

## 2021-05-24 DIAGNOSIS — M545 Low back pain, unspecified: Secondary | ICD-10-CM | POA: Diagnosis not present

## 2021-05-28 DIAGNOSIS — M160 Bilateral primary osteoarthritis of hip: Secondary | ICD-10-CM | POA: Diagnosis not present

## 2021-05-28 DIAGNOSIS — M1611 Unilateral primary osteoarthritis, right hip: Secondary | ICD-10-CM | POA: Diagnosis not present

## 2021-06-11 DIAGNOSIS — I10 Essential (primary) hypertension: Secondary | ICD-10-CM | POA: Diagnosis not present

## 2021-06-11 DIAGNOSIS — D519 Vitamin B12 deficiency anemia, unspecified: Secondary | ICD-10-CM | POA: Diagnosis not present

## 2021-06-11 DIAGNOSIS — Z6831 Body mass index (BMI) 31.0-31.9, adult: Secondary | ICD-10-CM | POA: Diagnosis not present

## 2021-06-11 DIAGNOSIS — J449 Chronic obstructive pulmonary disease, unspecified: Secondary | ICD-10-CM | POA: Diagnosis not present

## 2021-06-11 DIAGNOSIS — Z409 Encounter for prophylactic surgery, unspecified: Secondary | ICD-10-CM | POA: Diagnosis not present

## 2021-06-11 DIAGNOSIS — M1990 Unspecified osteoarthritis, unspecified site: Secondary | ICD-10-CM | POA: Diagnosis not present

## 2021-06-30 ENCOUNTER — Emergency Department (HOSPITAL_COMMUNITY)
Admission: EM | Admit: 2021-06-30 | Discharge: 2021-06-30 | Disposition: A | Discharge status: Home / Self Care | Payer: Medicare Other | Attending: Emergency Medicine | Admitting: Emergency Medicine

## 2021-06-30 ENCOUNTER — Other Ambulatory Visit: Payer: Self-pay

## 2021-06-30 ENCOUNTER — Encounter (HOSPITAL_COMMUNITY): Payer: Self-pay

## 2021-06-30 ENCOUNTER — Emergency Department (HOSPITAL_COMMUNITY): Payer: Medicare Other

## 2021-06-30 DIAGNOSIS — S0003XA Contusion of scalp, initial encounter: Secondary | ICD-10-CM | POA: Diagnosis not present

## 2021-06-30 DIAGNOSIS — I959 Hypotension, unspecified: Secondary | ICD-10-CM | POA: Diagnosis not present

## 2021-06-30 DIAGNOSIS — W19XXXA Unspecified fall, initial encounter: Secondary | ICD-10-CM

## 2021-06-30 DIAGNOSIS — S0101XA Laceration without foreign body of scalp, initial encounter: Secondary | ICD-10-CM

## 2021-06-30 DIAGNOSIS — I10 Essential (primary) hypertension: Secondary | ICD-10-CM | POA: Diagnosis not present

## 2021-06-30 DIAGNOSIS — W01198A Fall on same level from slipping, tripping and stumbling with subsequent striking against other object, initial encounter: Secondary | ICD-10-CM | POA: Insufficient documentation

## 2021-06-30 DIAGNOSIS — M50323 Other cervical disc degeneration at C6-C7 level: Secondary | ICD-10-CM | POA: Diagnosis not present

## 2021-06-30 DIAGNOSIS — Y92002 Bathroom of unspecified non-institutional (private) residence single-family (private) house as the place of occurrence of the external cause: Secondary | ICD-10-CM | POA: Diagnosis not present

## 2021-06-30 DIAGNOSIS — I6529 Occlusion and stenosis of unspecified carotid artery: Secondary | ICD-10-CM | POA: Diagnosis not present

## 2021-06-30 DIAGNOSIS — F17210 Nicotine dependence, cigarettes, uncomplicated: Secondary | ICD-10-CM | POA: Diagnosis not present

## 2021-06-30 DIAGNOSIS — S0181XA Laceration without foreign body of other part of head, initial encounter: Secondary | ICD-10-CM | POA: Insufficient documentation

## 2021-06-30 DIAGNOSIS — S199XXA Unspecified injury of neck, initial encounter: Secondary | ICD-10-CM | POA: Diagnosis not present

## 2021-06-30 DIAGNOSIS — S0990XA Unspecified injury of head, initial encounter: Secondary | ICD-10-CM | POA: Diagnosis not present

## 2021-06-30 DIAGNOSIS — M50322 Other cervical disc degeneration at C5-C6 level: Secondary | ICD-10-CM | POA: Diagnosis not present

## 2021-06-30 DIAGNOSIS — R58 Hemorrhage, not elsewhere classified: Secondary | ICD-10-CM | POA: Diagnosis not present

## 2021-06-30 HISTORY — DX: Essential (primary) hypertension: I10

## 2021-06-30 LAB — CBC WITH DIFFERENTIAL/PLATELET
Abs Immature Granulocytes: 0.06 10*3/uL (ref 0.00–0.07)
Basophils Absolute: 0.1 10*3/uL (ref 0.0–0.1)
Basophils Relative: 1 %
Eosinophils Absolute: 0.2 10*3/uL (ref 0.0–0.5)
Eosinophils Relative: 2 %
HCT: 40.1 % (ref 36.0–46.0)
Hemoglobin: 13.7 g/dL (ref 12.0–15.0)
Immature Granulocytes: 1 %
Lymphocytes Relative: 14 %
Lymphs Abs: 1.7 10*3/uL (ref 0.7–4.0)
MCH: 31.5 pg (ref 26.0–34.0)
MCHC: 34.2 g/dL (ref 30.0–36.0)
MCV: 92.2 fL (ref 80.0–100.0)
Monocytes Absolute: 0.9 10*3/uL (ref 0.1–1.0)
Monocytes Relative: 7 %
Neutro Abs: 9.1 10*3/uL — ABNORMAL HIGH (ref 1.7–7.7)
Neutrophils Relative %: 75 %
Platelets: 248 10*3/uL (ref 150–400)
RBC: 4.35 MIL/uL (ref 3.87–5.11)
RDW: 12 % (ref 11.5–15.5)
WBC: 12 10*3/uL — ABNORMAL HIGH (ref 4.0–10.5)
nRBC: 0 % (ref 0.0–0.2)

## 2021-06-30 LAB — COMPREHENSIVE METABOLIC PANEL
ALT: 16 U/L (ref 0–44)
AST: 21 U/L (ref 15–41)
Albumin: 3.8 g/dL (ref 3.5–5.0)
Alkaline Phosphatase: 51 U/L (ref 38–126)
Anion gap: 9 (ref 5–15)
BUN: 13 mg/dL (ref 8–23)
CO2: 24 mmol/L (ref 22–32)
Calcium: 8.9 mg/dL (ref 8.9–10.3)
Chloride: 97 mmol/L — ABNORMAL LOW (ref 98–111)
Creatinine, Ser: 0.73 mg/dL (ref 0.44–1.00)
GFR, Estimated: >60 mL/min (ref >60)
Glucose, Bld: 146 mg/dL — ABNORMAL HIGH (ref 70–99)
Potassium: 3.8 mmol/L (ref 3.5–5.1)
Sodium: 130 mmol/L — ABNORMAL LOW (ref 135–145)
Total Bilirubin: 0.5 mg/dL (ref 0.3–1.2)
Total Protein: 6.9 g/dL (ref 6.5–8.1)

## 2021-06-30 LAB — URINALYSIS, ROUTINE W REFLEX MICROSCOPIC
Bacteria, UA: NONE SEEN
Bilirubin Urine: NEGATIVE
Glucose, UA: NEGATIVE mg/dL
Ketones, ur: NEGATIVE mg/dL
Leukocytes,Ua: NEGATIVE
Nitrite: NEGATIVE
Protein, ur: NEGATIVE mg/dL
Specific Gravity, Urine: 1.012 (ref 1.005–1.030)
pH: 6 (ref 5.0–8.0)

## 2021-06-30 MED ORDER — ONDANSETRON 8 MG PO TBDP
8.0000 mg | ORAL_TABLET | Freq: Once | ORAL | Status: AC
  Administered 2021-06-30: 8 mg via ORAL
  Filled 2021-06-30: qty 1

## 2021-06-30 MED ORDER — LIDOCAINE-EPINEPHRINE (PF) 2 %-1:200000 IJ SOLN
20.0000 mL | Freq: Once | INTRAMUSCULAR | Status: AC
  Administered 2021-06-30: 20 mL

## 2021-06-30 MED ORDER — LIDOCAINE-EPINEPHRINE (PF) 2 %-1:200000 IJ SOLN
INTRAMUSCULAR | Status: AC
  Filled 2021-06-30: qty 20

## 2021-06-30 NOTE — ED Notes (Signed)
Patient transported to CT 

## 2021-06-30 NOTE — Discharge Instructions (Addendum)
The testing has been unremarkable, no signs of bleeding on the brain, broken bones in the neck, no signs of urinary infection, please stay home today resting drinking plenty of clear liquids and follow-up within 1 week to have the staples removed at your doctor's office.

## 2021-06-30 NOTE — ED Triage Notes (Signed)
Pt reports going to the bathroom at home when she fell and split at her anterior forehead open on the edge of the shower. Pt reports possible LOC. Pts husband found Pt and called EMS. Bleeding controlled at this time. EDP at bedside.

## 2021-06-30 NOTE — ED Provider Notes (Signed)
[facility] EMERGENCY DEPARTMENT Provider Note   CSN: [id number] Arrival date & time: 06/30/21  9379     History Chief Complaint  Patient presents with   Fall    2.5 cm laceration anterior head    Brittany Watson is a 75 y.o. female.  Mechanical fall when walking with her walker towards the bathroom.  She hit her head on a dresser causing laceration to her head.  She likely has some syncope at that time and she does not remember everything.  At this time she only hurts in her head.  She states that oftentimes gets a bit wobbly because of hip arthritis that she is getting surgery for her soon.  She also states that she might be [place] dehydrated as she did not drink enough water yesterday.  At this time she has no other complaints   Fall      Past Medical History:  Diagnosis Date   Hypertension     There are no problems to display for this patient.   History reviewed. No pertinent surgical history.   OB History   No obstetric history on file.     No family history on file.  Social History   Tobacco Use   Smoking status: Every Day    Packs/day: 2.00    Years: 30.00    Pack years: 60.00    Types: Cigarettes   Smokeless tobacco: Never   Tobacco comments:    Counseled to quit smoking. Discussed reduce to quit.  Vaping Use   Vaping Use: Never used  Substance Use Topics   Alcohol use: Not Currently   Drug use: Never    Home Medications Prior to Admission medications   Not on File    Allergies    Aspirin and Morphine  Review of Systems   Review of Systems  All other systems reviewed and are negative.  Physical Exam Updated Vital Signs BP (!) 126/94 (BP Location: Left Arm)   Pulse 79   Temp 98.5 F (36.9 C) (Oral)   Resp 16   Ht 5\' 3"  (1.6 m)   Wt 81.2 kg   SpO2 98%   BMI 31.71 kg/m   Physical Exam Vitals and nursing note reviewed.  Constitutional:      Appearance: She is well-developed.  HENT:     Head: Normocephalic.     Comments:  2.5 centimeter laceration to mid forehead around the hairline.  Well approximated.  Initially hemostatic.    Nose: Nose normal. No congestion or rhinorrhea.     Mouth/Throat:     Mouth: Mucous membranes are moist.     Pharynx: Oropharynx is clear.  Eyes:     Pupils: Pupils are equal, round, and reactive to light.  Cardiovascular:     Rate and Rhythm: Normal rate and regular rhythm.  Pulmonary:     Effort: No respiratory distress.     Breath sounds: No stridor.  Abdominal:     General: There is no distension.  Musculoskeletal:     Cervical back: Normal range of motion.  Skin:    General: Skin is warm and dry.  Neurological:     Mental Status: She is alert.    ED Results / Procedures / Treatments   Labs (all labs ordered are listed, but only abnormal results are displayed) Labs Reviewed  CBC WITH DIFFERENTIAL/PLATELET  COMPREHENSIVE METABOLIC PANEL  URINALYSIS, ROUTINE W REFLEX MICROSCOPIC    EKG None  Radiology No results found.  Procedures .Marland KitchenLaceration Repair  Date/Time: 06/30/2021 6:31 AM Performed by: Merrily Pew, MD Authorized by: Merrily Pew, MD   Consent:    Consent obtained:  Verbal   Consent given by:  Patient   Risks discussed:  Infection, need for additional repair, nerve damage, poor wound healing, poor cosmetic result, pain, retained foreign body, tendon damage and vascular damage   Alternatives discussed:  No treatment, delayed treatment and observation Universal protocol:    Procedure explained and questions answered to patient or proxy's satisfaction: yes     Relevant documents present and verified: yes     Patient identity confirmed:  Verbally with patient and hospital-assigned identification number Anesthesia:    Anesthesia method:  Local infiltration   Local anesthetic:  Lidocaine 2% WITH epi Laceration details:    Location:  Face   Face location:  Forehead   Length (cm):  2.5   Depth (mm):  5 Pre-procedure details:    Preparation:   Patient was prepped and draped in usual sterile fashion and imaging obtained to evaluate for foreign bodies Exploration:    Limited defect created (wound extended): no     Imaging outcome: foreign body not noted     Wound exploration: wound explored through full range of motion and entire depth of wound visualized     Contaminated: no   Treatment:    Area cleansed with:  Saline   Amount of cleaning:  Extensive   Irrigation solution:  Sterile water   Irrigation volume:  100   Irrigation method:  Syringe   Visualized foreign bodies/material removed: no     Debridement:  None   Undermining:  None   Scar revision: no   Skin repair:    Repair method:  Sutures   Suture size:  5-0   Suture material:  Fast-absorbing gut   Suture technique:  Simple interrupted   Number of sutures:  5 Approximation:    Approximation:  Close Repair type:    Repair type:  Simple Post-procedure details:    Dressing:  Antibiotic ointment   Procedure completion:  Tolerated well, no immediate complications   Medications Ordered in ED Medications  lidocaine-EPINEPHrine (XYLOCAINE W/EPI) 2 %-1:200000 (PF) injection 20 mL (20 mLs Infiltration Given 06/30/21 0620)    ED Course  I have reviewed the triage vital signs and the nursing notes.  Pertinent labs & imaging results that were available during my care of the patient were reviewed by me and considered in my medical decision making (see chart for details).    MDM Rules/Calculators/A&P                         Ct head/neck. Labs/UA 2/2 reported dehydration and unsteadiness on feet. Wound repaired as above with absorbable sutures.  Care transferred pending labs/imaging results.   Final Clinical Impression(s) / ED Diagnoses Final diagnoses:  None    Rx / DC Orders ED Discharge Orders     None        Nyx Keady, Corene Cornea, MD 07/01/21 [phone]

## 2021-06-30 NOTE — ED Provider Notes (Signed)
Patient accepted at change of shift, pending urinalysis, other labs and CT scan are unremarkable.  Wound repaired by prior emergency department physician, family will be updated on results, vital signs are stable, neurologically stable, questions answered, stable for discharge   Noemi Chapel, MD 06/30/21 [phone]

## 2021-07-15 DIAGNOSIS — H40023 Open angle with borderline findings, high risk, bilateral: Secondary | ICD-10-CM | POA: Diagnosis not present

## 2021-08-02 DIAGNOSIS — M1611 Unilateral primary osteoarthritis, right hip: Secondary | ICD-10-CM | POA: Diagnosis not present

## 2021-08-23 DIAGNOSIS — M1611 Unilateral primary osteoarthritis, right hip: Secondary | ICD-10-CM | POA: Diagnosis not present

## 2021-08-24 DIAGNOSIS — R42 Dizziness and giddiness: Secondary | ICD-10-CM | POA: Diagnosis not present

## 2021-08-24 DIAGNOSIS — R0902 Hypoxemia: Secondary | ICD-10-CM | POA: Diagnosis not present

## 2021-08-24 DIAGNOSIS — R0689 Other abnormalities of breathing: Secondary | ICD-10-CM | POA: Diagnosis not present

## 2021-08-24 DIAGNOSIS — R110 Nausea: Secondary | ICD-10-CM | POA: Diagnosis not present

## 2021-09-09 DIAGNOSIS — Z471 Aftercare following joint replacement surgery: Secondary | ICD-10-CM | POA: Diagnosis not present

## 2021-09-09 DIAGNOSIS — Z96641 Presence of right artificial hip joint: Secondary | ICD-10-CM | POA: Diagnosis not present

## 2021-09-17 DIAGNOSIS — I10 Essential (primary) hypertension: Secondary | ICD-10-CM | POA: Diagnosis not present

## 2021-09-17 DIAGNOSIS — Z23 Encounter for immunization: Secondary | ICD-10-CM | POA: Diagnosis not present

## 2021-09-17 DIAGNOSIS — I700 Atherosclerosis of aorta: Secondary | ICD-10-CM | POA: Diagnosis not present

## 2021-09-17 DIAGNOSIS — J449 Chronic obstructive pulmonary disease, unspecified: Secondary | ICD-10-CM | POA: Diagnosis not present

## 2021-10-07 DIAGNOSIS — Z96641 Presence of right artificial hip joint: Secondary | ICD-10-CM | POA: Diagnosis not present

## 2021-10-07 DIAGNOSIS — Z471 Aftercare following joint replacement surgery: Secondary | ICD-10-CM | POA: Diagnosis not present

## 2021-11-12 DIAGNOSIS — H40021 Open angle with borderline findings, high risk, right eye: Secondary | ICD-10-CM | POA: Diagnosis not present

## 2021-12-13 DIAGNOSIS — E785 Hyperlipidemia, unspecified: Secondary | ICD-10-CM | POA: Diagnosis not present

## 2021-12-13 DIAGNOSIS — I10 Essential (primary) hypertension: Secondary | ICD-10-CM | POA: Diagnosis not present

## 2021-12-13 DIAGNOSIS — J449 Chronic obstructive pulmonary disease, unspecified: Secondary | ICD-10-CM | POA: Diagnosis not present

## 2022-01-13 DIAGNOSIS — M1612 Unilateral primary osteoarthritis, left hip: Secondary | ICD-10-CM | POA: Diagnosis not present

## 2022-02-04 DIAGNOSIS — Z79899 Other long term (current) drug therapy: Secondary | ICD-10-CM | POA: Diagnosis not present

## 2022-02-04 DIAGNOSIS — M1612 Unilateral primary osteoarthritis, left hip: Secondary | ICD-10-CM | POA: Diagnosis not present

## 2022-02-10 ENCOUNTER — Ambulatory Visit: Payer: Self-pay | Admitting: Orthopedic Surgery

## 2022-02-10 NOTE — H&P (Signed)
TOTAL HIP ADMISSION H&P  Patient is admitted for left total hip arthroplasty.  Subjective:  Chief Complaint: left hip pain  HPI: Brittany Watson, 76 y.o. female, has a history of pain and functional disability in the left hip(s) due to arthritis and patient has failed non-surgical conservative treatments for greater than 12 weeks to include NSAID's and/or analgesics, flexibility and strengthening excercises, use of assistive devices, weight reduction as appropriate, and activity modification.  Onset of symptoms was gradual starting 2 years ago with rapidlly worsening course since that time.The patient noted no past surgery on the left hip(s).  Patient currently rates pain in the left hip at 10 out of 10 with activity. Patient has night pain, worsening of pain with activity and weight bearing, trendelenberg gait, pain that interfers with activities of daily living, pain with passive range of motion, and crepitus. Patient has evidence of subchondral cysts, subchondral sclerosis, periarticular osteophytes, and joint space narrowing by imaging studies. This condition presents safety issues increasing the risk of falls.  There is no current active infection.  There are no problems to display for this patient.  Past Medical History:  Diagnosis Date   Hypertension     No past surgical history on file.  No current outpatient medications on file.   No current facility-administered medications for this visit.   Allergies  Allergen Reactions   Aspirin Nausea Only   Morphine Other (See Comments) and Nausea And Vomiting    Family hx of allergy  Family hx of allergy      Social History   Tobacco Use   Smoking status: Every Day    Packs/day: 2.00    Years: 30.00    Pack years: 60.00    Types: Cigarettes   Smokeless tobacco: Never   Tobacco comments:    Counseled to quit smoking. Discussed reduce to quit.  Substance Use Topics   Alcohol use: Not Currently    No family history on file.    Review of Systems  Musculoskeletal:  Positive for arthralgias and gait problem.  All other systems reviewed and are negative.  Objective:  Physical Exam Constitutional:      Appearance: Normal appearance.  HENT:     Head: Normocephalic and atraumatic.     Nose: Nose normal.     Mouth/Throat:     Mouth: Mucous membranes are moist.     Pharynx: Oropharynx is clear.  Eyes:     Extraocular Movements: Extraocular movements intact.     Pupils: Pupils are equal, round, and reactive to light.  Cardiovascular:     Rate and Rhythm: Normal rate and regular rhythm.     Pulses: Normal pulses.  Pulmonary:     Effort: Pulmonary effort is normal. No respiratory distress.  Abdominal:     General: Abdomen is flat. There is no distension.     Palpations: Abdomen is soft.  Genitourinary:    Comments: deferred Musculoskeletal:     Cervical back: Normal range of motion and neck supple.  Skin:    General: Skin is warm and dry.     Capillary Refill: Capillary refill takes less than 2 seconds.  Neurological:     General: No focal deficit present.     Mental Status: She is alert and oriented to person, place, and time.  Psychiatric:        Mood and Affect: Mood normal.        Behavior: Behavior normal.        Thought Content: Thought  content normal.        Judgment: Judgment normal.    Vital signs in last 24 hours: @VSRANGES @  Labs:   Estimated body mass index is 31.71 kg/m as calculated from the following:   Height as of 06/30/21: 5\' 3"  (1.6 m).   Weight as of 06/30/21: 81.2 kg.   Imaging Review Plain radiographs demonstrate severe degenerative joint disease of the left hip(s). The bone quality appears to be adequate for age and reported activity level.      Assessment/Plan:  End stage arthritis, left hip(s)  The patient history, physical examination, clinical judgement of the provider and imaging studies are consistent with end stage degenerative joint disease of the left  hip(s) and total hip arthroplasty is deemed medically necessary. The treatment options including medical management, injection therapy, arthroscopy and arthroplasty were discussed at length. The risks and benefits of total hip arthroplasty were presented and reviewed. The risks due to aseptic loosening, infection, stiffness, dislocation/subluxation,  thromboembolic complications and other imponderables were discussed.  The patient acknowledged the explanation, agreed to proceed with the plan and consent was signed. Patient is being admitted for inpatient treatment for surgery, pain control, PT, OT, prophylactic antibiotics, VTE prophylaxis, progressive ambulation and ADL's and discharge planning.The patient is planning to be discharged  home with HEP.  No DME needs. Requests Tramadol for pain.    Patient's anticipated LOS is less than 2 midnights, meeting these requirements: - Younger than 65 - Lives within 1 hour of care - Has a competent adult at home to recover with post-op recover - NO history of  - Chronic pain requiring opiods  - Diabetes  - Coronary Artery Disease  - Heart failure  - Heart attack  - Stroke  - DVT/VTE  - Cardiac arrhythmia  - Respiratory Failure/COPD  - Renal failure  - Anemia  - Advanced Liver disease

## 2022-02-21 DIAGNOSIS — M1612 Unilateral primary osteoarthritis, left hip: Secondary | ICD-10-CM | POA: Diagnosis not present

## 2022-03-07 DIAGNOSIS — Z96642 Presence of left artificial hip joint: Secondary | ICD-10-CM | POA: Diagnosis not present

## 2022-03-07 DIAGNOSIS — Z471 Aftercare following joint replacement surgery: Secondary | ICD-10-CM | POA: Diagnosis not present

## 2022-03-07 DIAGNOSIS — Z4789 Encounter for other orthopedic aftercare: Secondary | ICD-10-CM | POA: Diagnosis not present

## 2022-03-14 DIAGNOSIS — E785 Hyperlipidemia, unspecified: Secondary | ICD-10-CM | POA: Diagnosis not present

## 2022-03-14 DIAGNOSIS — J449 Chronic obstructive pulmonary disease, unspecified: Secondary | ICD-10-CM | POA: Diagnosis not present

## 2022-03-14 DIAGNOSIS — I10 Essential (primary) hypertension: Secondary | ICD-10-CM | POA: Diagnosis not present

## 2022-04-07 DIAGNOSIS — Z4789 Encounter for other orthopedic aftercare: Secondary | ICD-10-CM | POA: Diagnosis not present

## 2022-04-22 ENCOUNTER — Other Ambulatory Visit: Payer: Self-pay | Admitting: *Deleted

## 2022-04-22 DIAGNOSIS — Z87891 Personal history of nicotine dependence: Secondary | ICD-10-CM

## 2022-04-22 DIAGNOSIS — Z122 Encounter for screening for malignant neoplasm of respiratory organs: Secondary | ICD-10-CM

## 2022-05-05 DIAGNOSIS — H40021 Open angle with borderline findings, high risk, right eye: Secondary | ICD-10-CM | POA: Diagnosis not present

## 2022-06-13 ENCOUNTER — Ambulatory Visit (HOSPITAL_COMMUNITY)
Admission: RE | Admit: 2022-06-13 | Discharge: 2022-06-13 | Disposition: A | Discharge status: Home / Self Care | Payer: Medicare Other | Source: Ambulatory Visit | Attending: Internal Medicine | Admitting: Internal Medicine

## 2022-06-13 DIAGNOSIS — Z122 Encounter for screening for malignant neoplasm of respiratory organs: Secondary | ICD-10-CM | POA: Diagnosis not present

## 2022-06-13 DIAGNOSIS — Z87891 Personal history of nicotine dependence: Secondary | ICD-10-CM | POA: Insufficient documentation

## 2022-06-16 ENCOUNTER — Other Ambulatory Visit: Payer: Self-pay

## 2022-06-16 DIAGNOSIS — Z122 Encounter for screening for malignant neoplasm of respiratory organs: Secondary | ICD-10-CM

## 2022-06-16 DIAGNOSIS — F17210 Nicotine dependence, cigarettes, uncomplicated: Secondary | ICD-10-CM

## 2022-06-16 DIAGNOSIS — Z87891 Personal history of nicotine dependence: Secondary | ICD-10-CM

## 2022-07-24 DIAGNOSIS — H26491 Other secondary cataract, right eye: Secondary | ICD-10-CM | POA: Diagnosis not present

## 2022-08-01 ENCOUNTER — Telehealth: Payer: Self-pay | Admitting: *Deleted

## 2022-08-01 NOTE — Patient Outreach (Signed)
  Care Coordination   08/01/2022 Name: Brittany Watson MRN: [id number] DOB: [birth year]   Care Coordination Outreach Attempts:  An unsuccessful telephone outreach was attempted today to offer the patient information about available care coordination services as a benefit of their health plan.   Follow Up Plan:  Additional outreach attempts will be made to offer the patient care coordination information and services.   Encounter Outcome:  No Answer  Care Coordination Interventions Activated:  No   Care Coordination Interventions:  No, not indicated    Chong Sicilian, BSN, RN-BC RN Care Coordinator Direct Dial: [phone]

## 2022-08-07 DIAGNOSIS — H33302 Unspecified retinal break, left eye: Secondary | ICD-10-CM | POA: Diagnosis not present

## 2022-08-26 ENCOUNTER — Telehealth: Payer: Self-pay | Admitting: *Deleted

## 2022-08-26 ENCOUNTER — Encounter: Payer: Self-pay | Admitting: *Deleted

## 2022-08-26 NOTE — Patient Outreach (Signed)
  Care Coordination   Initial Visit Note   08/26/2022 Name: Brittany Watson MRN: [id number] DOB: Jun 25, 1946  Brittany Watson is a 76 y.o. year old female who sees Asencion Noble, MD for primary care. I spoke with  Brittany Watson by phone today.  What matters to the patients health and wellness today?  Ongoing self management of chronic medical conditions     Goals Addressed             This Visit's Progress    COMPLETED: Care Coordination Services (no follow-up needed)       Care Coordination Interventions: Reviewed medications with patient and discussed affordability Assessed social determinant of health barriers Assessed mobility and ability to perform ADLs Assessed family/Social support Provided patient/caregiver with verbal information on [place] [phone]) Encouraged patient to request a referral for [place] from PCP if services are needed in the future         SDOH assessments and interventions completed:  Yes  SDOH Interventions Today    Flowsheet Row Most Recent Value  SDOH Interventions   Food Insecurity Interventions Intervention Not Indicated  Housing Interventions Intervention Not Indicated  Transportation Interventions Intervention Not Indicated  Utilities Interventions Intervention Not Indicated  Financial Strain Interventions Intervention Not Indicated        Care Coordination Interventions Activated:  Yes  Care Coordination Interventions:  Yes, provided   Follow up plan: No further intervention required.   Encounter Outcome:  Pt. Visit Completed   Chong Sicilian, BSN, RN-BC RN Care Coordinator [place] Direct Dial: [phone] Main #: [phone]

## 2022-09-09 DIAGNOSIS — Z96642 Presence of left artificial hip joint: Secondary | ICD-10-CM | POA: Diagnosis not present

## 2022-09-13 DIAGNOSIS — E785 Hyperlipidemia, unspecified: Secondary | ICD-10-CM | POA: Diagnosis not present

## 2022-09-13 DIAGNOSIS — I10 Essential (primary) hypertension: Secondary | ICD-10-CM | POA: Diagnosis not present

## 2022-09-13 DIAGNOSIS — J449 Chronic obstructive pulmonary disease, unspecified: Secondary | ICD-10-CM | POA: Diagnosis not present

## 2023-03-17 DIAGNOSIS — H40053 Ocular hypertension, bilateral: Secondary | ICD-10-CM | POA: Diagnosis not present

## 2023-04-23 DIAGNOSIS — H40021 Open angle with borderline findings, high risk, right eye: Secondary | ICD-10-CM | POA: Diagnosis not present

## 2023-04-29 DIAGNOSIS — H40023 Open angle with borderline findings, high risk, bilateral: Secondary | ICD-10-CM | POA: Diagnosis not present

## 2023-05-19 DIAGNOSIS — H401131 Primary open-angle glaucoma, bilateral, mild stage: Secondary | ICD-10-CM | POA: Diagnosis not present

## 2023-06-16 ENCOUNTER — Ambulatory Visit (HOSPITAL_COMMUNITY)
Admission: RE | Admit: 2023-06-16 | Discharge: 2023-06-16 | Disposition: A | Discharge status: Home / Self Care | Payer: Medicare Other | Source: Ambulatory Visit | Attending: Internal Medicine | Admitting: Internal Medicine

## 2023-06-16 DIAGNOSIS — F17210 Nicotine dependence, cigarettes, uncomplicated: Secondary | ICD-10-CM

## 2023-06-16 DIAGNOSIS — Z87891 Personal history of nicotine dependence: Secondary | ICD-10-CM

## 2023-06-16 DIAGNOSIS — Z122 Encounter for screening for malignant neoplasm of respiratory organs: Secondary | ICD-10-CM

## 2023-06-19 ENCOUNTER — Other Ambulatory Visit: Payer: Self-pay | Admitting: Acute Care

## 2023-06-19 DIAGNOSIS — Z122 Encounter for screening for malignant neoplasm of respiratory organs: Secondary | ICD-10-CM

## 2023-06-19 DIAGNOSIS — Z87891 Personal history of nicotine dependence: Secondary | ICD-10-CM

## 2023-07-10 DIAGNOSIS — J449 Chronic obstructive pulmonary disease, unspecified: Secondary | ICD-10-CM | POA: Diagnosis not present

## 2023-07-10 DIAGNOSIS — I700 Atherosclerosis of aorta: Secondary | ICD-10-CM | POA: Diagnosis not present

## 2023-07-10 DIAGNOSIS — I10 Essential (primary) hypertension: Secondary | ICD-10-CM | POA: Diagnosis not present

## 2023-07-10 DIAGNOSIS — Z79899 Other long term (current) drug therapy: Secondary | ICD-10-CM | POA: Diagnosis not present

## 2023-07-16 DIAGNOSIS — I10 Essential (primary) hypertension: Secondary | ICD-10-CM | POA: Diagnosis not present

## 2023-07-16 DIAGNOSIS — E785 Hyperlipidemia, unspecified: Secondary | ICD-10-CM | POA: Diagnosis not present

## 2023-07-16 DIAGNOSIS — F411 Generalized anxiety disorder: Secondary | ICD-10-CM | POA: Diagnosis not present

## 2023-08-11 DIAGNOSIS — H401131 Primary open-angle glaucoma, bilateral, mild stage: Secondary | ICD-10-CM | POA: Diagnosis not present

## 2023-11-16 DIAGNOSIS — H401131 Primary open-angle glaucoma, bilateral, mild stage: Secondary | ICD-10-CM | POA: Diagnosis not present

## 2024-01-15 DIAGNOSIS — I700 Atherosclerosis of aorta: Secondary | ICD-10-CM | POA: Diagnosis not present

## 2024-01-15 DIAGNOSIS — Z23 Encounter for immunization: Secondary | ICD-10-CM | POA: Diagnosis not present

## 2024-01-15 DIAGNOSIS — J440 Chronic obstructive pulmonary disease with acute lower respiratory infection: Secondary | ICD-10-CM | POA: Diagnosis not present

## 2024-01-15 DIAGNOSIS — I10 Essential (primary) hypertension: Secondary | ICD-10-CM | POA: Diagnosis not present

## 2024-03-21 DIAGNOSIS — H401131 Primary open-angle glaucoma, bilateral, mild stage: Secondary | ICD-10-CM | POA: Diagnosis not present

## 2024-06-22 ENCOUNTER — Telehealth: Payer: Self-pay

## 2024-06-22 NOTE — Telephone Encounter (Signed)
 LVM and mailed letter to schedule annual Lung CT.

## 2024-07-04 ENCOUNTER — Other Ambulatory Visit: Payer: Self-pay | Admitting: Acute Care

## 2024-07-04 DIAGNOSIS — Z122 Encounter for screening for malignant neoplasm of respiratory organs: Secondary | ICD-10-CM

## 2024-07-04 DIAGNOSIS — Z87891 Personal history of nicotine dependence: Secondary | ICD-10-CM

## 2024-07-11 DIAGNOSIS — I700 Atherosclerosis of aorta: Secondary | ICD-10-CM | POA: Diagnosis not present

## 2024-07-11 DIAGNOSIS — Z79899 Other long term (current) drug therapy: Secondary | ICD-10-CM | POA: Diagnosis not present

## 2024-07-11 DIAGNOSIS — I10 Essential (primary) hypertension: Secondary | ICD-10-CM | POA: Diagnosis not present

## 2024-07-13 ENCOUNTER — Ambulatory Visit (HOSPITAL_COMMUNITY)
Admission: RE | Admit: 2024-07-13 | Discharge: 2024-07-13 | Disposition: A | Discharge status: Home / Self Care | Source: Ambulatory Visit | Attending: Acute Care | Admitting: Acute Care

## 2024-07-13 DIAGNOSIS — Z87891 Personal history of nicotine dependence: Secondary | ICD-10-CM | POA: Diagnosis not present

## 2024-07-13 DIAGNOSIS — Z122 Encounter for screening for malignant neoplasm of respiratory organs: Secondary | ICD-10-CM | POA: Insufficient documentation

## 2024-07-15 DIAGNOSIS — J440 Chronic obstructive pulmonary disease with acute lower respiratory infection: Secondary | ICD-10-CM | POA: Diagnosis not present

## 2024-07-15 DIAGNOSIS — Z23 Encounter for immunization: Secondary | ICD-10-CM | POA: Diagnosis not present

## 2024-07-15 DIAGNOSIS — I10 Essential (primary) hypertension: Secondary | ICD-10-CM | POA: Diagnosis not present

## 2024-07-15 DIAGNOSIS — I700 Atherosclerosis of aorta: Secondary | ICD-10-CM | POA: Diagnosis not present

## 2024-07-18 ENCOUNTER — Other Ambulatory Visit (HOSPITAL_COMMUNITY): Payer: Self-pay | Admitting: Internal Medicine

## 2024-07-18 ENCOUNTER — Other Ambulatory Visit: Payer: Self-pay

## 2024-07-18 DIAGNOSIS — Z87891 Personal history of nicotine dependence: Secondary | ICD-10-CM

## 2024-07-18 DIAGNOSIS — R928 Other abnormal and inconclusive findings on diagnostic imaging of breast: Secondary | ICD-10-CM

## 2024-07-18 DIAGNOSIS — N63 Unspecified lump in unspecified breast: Secondary | ICD-10-CM

## 2024-07-18 DIAGNOSIS — Z122 Encounter for screening for malignant neoplasm of respiratory organs: Secondary | ICD-10-CM

## 2024-07-26 DIAGNOSIS — H401121 Primary open-angle glaucoma, left eye, mild stage: Secondary | ICD-10-CM | POA: Diagnosis not present

## 2024-08-04 ENCOUNTER — Ambulatory Visit (HOSPITAL_COMMUNITY)
Admission: RE | Admit: 2024-08-04 | Discharge: 2024-08-04 | Disposition: A | Discharge status: Home / Self Care | Source: Ambulatory Visit | Attending: Internal Medicine | Admitting: Internal Medicine

## 2024-08-04 DIAGNOSIS — R928 Other abnormal and inconclusive findings on diagnostic imaging of breast: Secondary | ICD-10-CM | POA: Insufficient documentation

## 2024-08-04 DIAGNOSIS — N63 Unspecified lump in unspecified breast: Secondary | ICD-10-CM | POA: Diagnosis not present

## 2024-08-04 DIAGNOSIS — R92333 Mammographic heterogeneous density, bilateral breasts: Secondary | ICD-10-CM | POA: Diagnosis not present

## 2024-09-02 DIAGNOSIS — H40053 Ocular hypertension, bilateral: Secondary | ICD-10-CM | POA: Diagnosis not present

## 2024-09-02 DIAGNOSIS — H25812 Combined forms of age-related cataract, left eye: Secondary | ICD-10-CM | POA: Diagnosis not present

## 2024-09-02 DIAGNOSIS — H40052 Ocular hypertension, left eye: Secondary | ICD-10-CM | POA: Diagnosis not present

## 2024-10-03 DIAGNOSIS — I10 Essential (primary) hypertension: Secondary | ICD-10-CM | POA: Diagnosis not present

## 2024-10-03 DIAGNOSIS — H25812 Combined forms of age-related cataract, left eye: Secondary | ICD-10-CM | POA: Diagnosis not present

## 2024-10-03 DIAGNOSIS — J449 Chronic obstructive pulmonary disease, unspecified: Secondary | ICD-10-CM | POA: Diagnosis not present

## 2024-10-03 DIAGNOSIS — H2512 Age-related nuclear cataract, left eye: Secondary | ICD-10-CM | POA: Diagnosis not present

## 2024-10-03 DIAGNOSIS — H40052 Ocular hypertension, left eye: Secondary | ICD-10-CM | POA: Diagnosis not present
